# Patient Record
Sex: Female | Born: 1960 | Race: Black or African American | Hispanic: No | Marital: Single | Smoking: Former smoker
Health system: Southern US, Community
[De-identification: ages and names within clinical notes are randomized; demographics above are authoritative.]

## PROBLEM LIST (undated history)

## (undated) DIAGNOSIS — E785 Hyperlipidemia, unspecified: Secondary | ICD-10-CM

## (undated) DIAGNOSIS — I2699 Other pulmonary embolism without acute cor pulmonale: Secondary | ICD-10-CM

## (undated) DIAGNOSIS — M1990 Unspecified osteoarthritis, unspecified site: Secondary | ICD-10-CM

## (undated) DIAGNOSIS — T7840XA Allergy, unspecified, initial encounter: Secondary | ICD-10-CM

## (undated) DIAGNOSIS — I10 Essential (primary) hypertension: Secondary | ICD-10-CM

## (undated) DIAGNOSIS — I82409 Acute embolism and thrombosis of unspecified deep veins of unspecified lower extremity: Secondary | ICD-10-CM

## (undated) DIAGNOSIS — F32A Depression, unspecified: Secondary | ICD-10-CM

## (undated) DIAGNOSIS — F329 Major depressive disorder, single episode, unspecified: Secondary | ICD-10-CM

## (undated) HISTORY — PX: CHOLECYSTECTOMY: SHX55

## (undated) HISTORY — PX: BACK SURGERY: SHX140

## (undated) HISTORY — DX: Allergy, unspecified, initial encounter: T78.40XA

## (undated) HISTORY — DX: Hyperlipidemia, unspecified: E78.5

## (undated) HISTORY — DX: Major depressive disorder, single episode, unspecified: F32.9

## (undated) HISTORY — PX: TONSILLECTOMY: SUR1361

## (undated) HISTORY — DX: Depression, unspecified: F32.A

## (undated) HISTORY — PX: ABDOMINAL HYSTERECTOMY: SHX81

## (undated) HISTORY — DX: Unspecified osteoarthritis, unspecified site: M19.90

---

## 1998-09-28 ENCOUNTER — Other Ambulatory Visit: Admission: RE | Admit: 1998-09-28 | Discharge: 1998-09-28 | Payer: Self-pay | Admitting: Obstetrics and Gynecology

## 1999-09-05 ENCOUNTER — Ambulatory Visit (HOSPITAL_COMMUNITY): Admission: RE | Admit: 1999-09-05 | Discharge: 1999-09-05 | Payer: Self-pay | Admitting: Obstetrics & Gynecology

## 1999-09-05 ENCOUNTER — Encounter: Payer: Self-pay | Admitting: Obstetrics & Gynecology

## 1999-10-24 ENCOUNTER — Other Ambulatory Visit: Admission: RE | Admit: 1999-10-24 | Discharge: 1999-10-24 | Payer: Self-pay | Admitting: Obstetrics and Gynecology

## 2001-03-24 ENCOUNTER — Other Ambulatory Visit: Admission: RE | Admit: 2001-03-24 | Discharge: 2001-03-24 | Payer: Self-pay | Admitting: Obstetrics and Gynecology

## 2002-01-05 ENCOUNTER — Ambulatory Visit (HOSPITAL_COMMUNITY): Admission: RE | Admit: 2002-01-05 | Discharge: 2002-01-05 | Payer: Self-pay | Admitting: Obstetrics and Gynecology

## 2002-01-05 ENCOUNTER — Encounter: Payer: Self-pay | Admitting: Obstetrics and Gynecology

## 2002-04-08 ENCOUNTER — Other Ambulatory Visit: Admission: RE | Admit: 2002-04-08 | Discharge: 2002-04-08 | Payer: Self-pay | Admitting: Obstetrics and Gynecology

## 2003-04-20 ENCOUNTER — Other Ambulatory Visit: Admission: RE | Admit: 2003-04-20 | Discharge: 2003-04-20 | Payer: Self-pay | Admitting: Obstetrics and Gynecology

## 2003-12-30 ENCOUNTER — Encounter: Admission: RE | Admit: 2003-12-30 | Discharge: 2003-12-30 | Payer: Self-pay | Admitting: Obstetrics and Gynecology

## 2004-04-20 ENCOUNTER — Other Ambulatory Visit: Admission: RE | Admit: 2004-04-20 | Discharge: 2004-04-20 | Payer: Self-pay | Admitting: Obstetrics and Gynecology

## 2005-02-01 ENCOUNTER — Encounter: Admission: RE | Admit: 2005-02-01 | Discharge: 2005-05-02 | Payer: Self-pay | Admitting: Internal Medicine

## 2014-12-06 ENCOUNTER — Emergency Department (HOSPITAL_BASED_OUTPATIENT_CLINIC_OR_DEPARTMENT_OTHER)
Admission: EM | Admit: 2014-12-06 | Discharge: 2014-12-06 | Disposition: A | Discharge status: Home / Self Care | Payer: 59 | Attending: Emergency Medicine | Admitting: Emergency Medicine

## 2014-12-06 ENCOUNTER — Encounter (HOSPITAL_BASED_OUTPATIENT_CLINIC_OR_DEPARTMENT_OTHER): Payer: Self-pay

## 2014-12-06 DIAGNOSIS — K625 Hemorrhage of anus and rectum: Secondary | ICD-10-CM | POA: Insufficient documentation

## 2014-12-06 DIAGNOSIS — I10 Essential (primary) hypertension: Secondary | ICD-10-CM | POA: Diagnosis not present

## 2014-12-06 DIAGNOSIS — Z7901 Long term (current) use of anticoagulants: Secondary | ICD-10-CM | POA: Insufficient documentation

## 2014-12-06 DIAGNOSIS — Z86718 Personal history of other venous thrombosis and embolism: Secondary | ICD-10-CM | POA: Diagnosis not present

## 2014-12-06 DIAGNOSIS — Z86711 Personal history of pulmonary embolism: Secondary | ICD-10-CM | POA: Diagnosis not present

## 2014-12-06 HISTORY — DX: Essential (primary) hypertension: I10

## 2014-12-06 HISTORY — DX: Acute embolism and thrombosis of unspecified deep veins of unspecified lower extremity: I82.409

## 2014-12-06 HISTORY — DX: Other pulmonary embolism without acute cor pulmonale: I26.99

## 2014-12-06 LAB — COMPREHENSIVE METABOLIC PANEL
ALT: 18 U/L (ref 14–54)
AST: 18 U/L (ref 15–41)
Albumin: 3.5 g/dL (ref 3.5–5.0)
Alkaline Phosphatase: 94 U/L (ref 38–126)
Anion gap: 7 (ref 5–15)
BUN: 23 mg/dL — ABNORMAL HIGH (ref 6–20)
CO2: 24 mmol/L (ref 22–32)
Calcium: 9 mg/dL (ref 8.9–10.3)
Chloride: 109 mmol/L (ref 101–111)
Creatinine, Ser: 0.85 mg/dL (ref 0.44–1.00)
GFR calc Af Amer: >60 mL/min (ref >60)
GFR calc non Af Amer: >60 mL/min (ref >60)
Glucose, Bld: 111 mg/dL — ABNORMAL HIGH (ref 70–99)
Potassium: 3.8 mmol/L (ref 3.5–5.1)
Sodium: 140 mmol/L (ref 135–145)
Total Bilirubin: 0.4 mg/dL (ref 0.3–1.2)
Total Protein: 7.7 g/dL (ref 6.5–8.1)

## 2014-12-06 LAB — CBC WITH DIFFERENTIAL/PLATELET
Basophils Absolute: 0.1 10*3/uL (ref 0.0–0.1)
Basophils Relative: 1 % (ref 0–1)
Eosinophils Absolute: 0.2 10*3/uL (ref 0.0–0.7)
Eosinophils Relative: 2 % (ref 0–5)
HCT: 41.3 % (ref 36.0–46.0)
Hemoglobin: 14 g/dL (ref 12.0–15.0)
Lymphocytes Relative: 30 % (ref 12–46)
Lymphs Abs: 2.5 10*3/uL (ref 0.7–4.0)
MCH: 30.1 pg (ref 26.0–34.0)
MCHC: 33.9 g/dL (ref 30.0–36.0)
MCV: 88.8 fL (ref 78.0–100.0)
Monocytes Absolute: 0.8 10*3/uL (ref 0.1–1.0)
Monocytes Relative: 9 % (ref 3–12)
Neutro Abs: 4.8 10*3/uL (ref 1.7–7.7)
Neutrophils Relative %: 58 % (ref 43–77)
Platelets: 366 10*3/uL (ref 150–400)
RBC: 4.65 MIL/uL (ref 3.87–5.11)
RDW: 13.9 % (ref 11.5–15.5)
WBC: 8.3 10*3/uL (ref 4.0–10.5)

## 2014-12-06 NOTE — Discharge Instructions (Signed)
Return to the ED with any concerns including abdominal pain, increased blood, vomiting blood, fainting, dizziness upon standing. Shortness of breathing, decreased level of alertness/lethargy, or any other alarming symptoms

## 2014-12-06 NOTE — ED Notes (Signed)
Reports 2 episodes of rectal bleeding since last night. Pt is on Xarelto due to recent PE and DVT.

## 2014-12-06 NOTE — ED Provider Notes (Signed)
CSN: [id number]     Arrival date & time 12/06/14  1806 History   First MD Initiated Contact with Patient 12/06/14 2019     Chief Complaint  Patient presents with  . Rectal Bleeding     (Consider location/radiation/quality/duration/timing/severity/associated sxs/prior Treatment) HPI  Pt with hx of DVT/PE presenting at her doctor's request to get her CBC checked.  She states she was switched from coumadin to xarelto last week after being started on blood thinners during recent hospitalization for PE and DVT.  Pt yesterday noted 2 bowel movements when she wiped afterwards and noted blood on the toilet tissue.  No abdominal pain.  No large amounts of rectal bleeding, no dark tarry stool.  No vomiting.  No easy bruising or bleeding.  No change in her leg pain or swelling, no chest pain or difficulty breathing.  She called her doctor this morning who advised her to have her hemoglobin checked at an urgent care today.  She has not taken this morning or evenings doses of xarelto.  There are no other associated systemic symptoms, there are no other alleviating or modifying factors.   Past Medical History  Diagnosis Date  . DVT (deep venous thrombosis)   . Pulmonary emboli   . Hypertension    Past Surgical History  Procedure Laterality Date  . Abdominal hysterectomy    . Cholecystectomy     No family history on file. History  Substance Use Topics  . Smoking status: Never Smoker   . Smokeless tobacco: Not on file  . Alcohol Use: No   OB History    No data available     Review of Systems  ROS reviewed and all otherwise negative except for mentioned in HPI    Allergies  Codeine  Home Medications   Prior to Admission medications   Medication Sig Start Date End Date Taking? Authorizing Provider  Rivaroxaban (XARELTO) 15 MG TABS tablet Take 15 mg by mouth 2 (two) times daily with a meal.   Yes Historical Provider, MD   BP 162/79 mmHg  Pulse 85  Temp(Src) 98.4 F (36.9 C) (Oral)   Resp 18  Ht 5\' 8"  (1.727 m)  Wt 375 lb (170.099 kg)  BMI 57.03 kg/m2  SpO2 99%  Vitals reviewed Physical Exam  Physical Examination: General appearance - alert, well appearing, and in no distress Mental status - alert, oriented to person, place, and time Eyes - no conjunctival injection no scleral icterus Mouth - mucous membranes moist, pharynx normal without lesions Chest - clear to auscultation, no wheezes, rales or rhonchi, symmetric air entry Heart - normal rate, regular rhythm, normal S1, S2, no murmurs, rubs, clicks or gallops Abdomen - soft, nontender, nondistended, no masses or organomegaly Extremities - peripheral pulses normal, no pedal edema, no clubbing or cyanosis Skin - normal coloration and turgor, no rashes  ED Course  Procedures (including critical care time) Labs Review Labs Reviewed  COMPREHENSIVE METABOLIC PANEL - Abnormal; Notable for the following:    Glucose, Bld 111 (*)    BUN 23 (*)    All other components within normal limits  CBC WITH DIFFERENTIAL/PLATELET    Imaging Review No results found.   EKG Interpretation None      MDM   Final diagnoses:  Rectal bleeding    Pt presenting after 2 episodes of blood on toilet tissue.  She was started on xarelto last week.  She was advised by her doctor to have her hemoglobin checked today.  Her doctor  told her she would let her know what the plan would be about her xarelto after having this checked. Pt has skipped her doses for today.  Is going to talk to her doctor in the morning.  Hemoglobin is 14.  No active bleeding or abdominal pain.  No syncope or lightheadedness.  D/w patient that GI bleeds can become life threatening- no sign of this at this time.  She is in close contact with her doctor, texting her the results.  She will followup- I agree with holding her xarelto until plan confirmed with her doctor. Discharged with strict return precautions.  Pt agreeable with plan.    Jerelyn ScottMartha Linker, MD 12/06/14  2213

## 2017-02-04 ENCOUNTER — Emergency Department (HOSPITAL_COMMUNITY): Payer: 59

## 2017-02-04 ENCOUNTER — Encounter (HOSPITAL_COMMUNITY): Payer: Self-pay

## 2017-02-04 ENCOUNTER — Encounter (HOSPITAL_COMMUNITY): Payer: Self-pay | Admitting: Emergency Medicine

## 2017-02-04 ENCOUNTER — Observation Stay (HOSPITAL_COMMUNITY)
Admission: EM | Admit: 2017-02-04 | Discharge: 2017-02-06 | DRG: 176 | Disposition: A | Discharge status: Home / Self Care | Payer: 59 | Attending: Internal Medicine | Admitting: Internal Medicine

## 2017-02-04 ENCOUNTER — Ambulatory Visit (HOSPITAL_COMMUNITY)
Admission: EM | Admit: 2017-02-04 | Discharge: 2017-02-04 | Disposition: A | Discharge status: Nursing Home (Includes ICF) | Payer: 59 | Source: Home / Self Care | Attending: Internal Medicine | Admitting: Internal Medicine

## 2017-02-04 DIAGNOSIS — Z86711 Personal history of pulmonary embolism: Secondary | ICD-10-CM | POA: Diagnosis present

## 2017-02-04 DIAGNOSIS — I10 Essential (primary) hypertension: Secondary | ICD-10-CM | POA: Diagnosis present

## 2017-02-04 DIAGNOSIS — R0600 Dyspnea, unspecified: Secondary | ICD-10-CM

## 2017-02-04 DIAGNOSIS — R42 Dizziness and giddiness: Secondary | ICD-10-CM

## 2017-02-04 DIAGNOSIS — Z79899 Other long term (current) drug therapy: Secondary | ICD-10-CM

## 2017-02-04 DIAGNOSIS — R0602 Shortness of breath: Secondary | ICD-10-CM

## 2017-02-04 DIAGNOSIS — I2782 Chronic pulmonary embolism: Secondary | ICD-10-CM

## 2017-02-04 DIAGNOSIS — I2699 Other pulmonary embolism without acute cor pulmonale: Secondary | ICD-10-CM | POA: Diagnosis not present

## 2017-02-04 DIAGNOSIS — Z86718 Personal history of other venous thrombosis and embolism: Secondary | ICD-10-CM | POA: Diagnosis not present

## 2017-02-04 DIAGNOSIS — I2690 Septic pulmonary embolism without acute cor pulmonale: Secondary | ICD-10-CM

## 2017-02-04 LAB — BASIC METABOLIC PANEL
Anion gap: 12 (ref 5–15)
BUN: 15 mg/dL (ref 6–20)
CHLORIDE: 104 mmol/L (ref 101–111)
CO2: 21 mmol/L — AB (ref 22–32)
CREATININE: 0.95 mg/dL (ref 0.44–1.00)
Calcium: 9.5 mg/dL (ref 8.9–10.3)
GFR calc Af Amer: >60 mL/min (ref >60)
GFR calc non Af Amer: >60 mL/min (ref >60)
GLUCOSE: 93 mg/dL (ref 65–99)
Potassium: 3.9 mmol/L (ref 3.5–5.1)
SODIUM: 137 mmol/L (ref 135–145)

## 2017-02-04 LAB — D-DIMER, QUANTITATIVE: D-Dimer, Quant: 1.33 ug/mL-FEU — ABNORMAL HIGH (ref 0.00–0.50)

## 2017-02-04 LAB — I-STAT TROPONIN, ED: Troponin i, poc: 0 ng/mL (ref 0.00–0.08)

## 2017-02-04 LAB — CBC
HCT: 45.2 % (ref 36.0–46.0)
Hemoglobin: 14.9 g/dL (ref 12.0–15.0)
MCH: 30.5 pg (ref 26.0–34.0)
MCHC: 33 g/dL (ref 30.0–36.0)
MCV: 92.4 fL (ref 78.0–100.0)
PLATELETS: 320 10*3/uL (ref 150–400)
RBC: 4.89 MIL/uL (ref 3.87–5.11)
RDW: 13.8 % (ref 11.5–15.5)
WBC: 9.2 10*3/uL (ref 4.0–10.5)

## 2017-02-04 NOTE — ED Provider Notes (Signed)
[facility]-EMERGENCY DEPT Provider Note   CSN: [id number] Arrival date & time: 02/04/17  1455  By signing my name below, I, Ny'Kea Lewis, attest that this documentation has been prepared under the direction and in the presence of Endiya Klahr, MD. Electronically Signed: Karren Cobble, ED Scribe. 02/04/17. 11:49 PM.  History   Chief Complaint Chief Complaint  Patient presents with  . Shortness of Breath   The history is provided by the patient. No language interpreter was used.  Shortness of Breath  This is a recurrent problem. The problem occurs continuously.The current episode started 6 to 12 hours ago. The problem has not changed since onset.Associated symptoms include wheezing and leg swelling. Pertinent negatives include no fever. It is unknown what precipitated the problem. She has tried nothing for the symptoms. The treatment provided no relief. She has had prior hospitalizations. She has had prior ED visits. Associated medical issues include PE and DVT.    HPI Comments: Latasha Moore is a 56 y.o. female with a history of DVT, HTN, and PE, who presents to the Emergency Department complaining of gradually worsening, shortness of breath that began one week ago. Pt notes associated intermittent right leg swelling, and mild wheezing. She was seen at [facility] and sent to the ED for further evaluation. Her D-Dimer results were remarkable. She reports a history of DVT and states the last time she had one was about three years ago. She was prescribed Xarelto and stayed on that for ten months. Pt is not currently on anticoagulants. She reports back in May she drove back and forth to [place] in five hour increments.    Past Medical History:  Diagnosis Date  . DVT (deep venous thrombosis) (HCC)   . Hypertension   . Pulmonary emboli (HCC)    There are no active problems to display for this patient.  Past Surgical History:  Procedure Laterality Date  . ABDOMINAL HYSTERECTOMY    .  CHOLECYSTECTOMY     OB History    No data available     Home Medications    Prior to Admission medications   Medication Sig Start Date End Date Taking? Authorizing Provider  Rivaroxaban (XARELTO) 15 MG TABS tablet Take 15 mg by mouth 2 (two) times daily with a meal.    [provider]  valsartan (DIOVAN) 160 MG tablet Take 160 mg by mouth daily.    [provider]   Family History History reviewed. No pertinent family history.  Social History Social History  Substance Use Topics  . Smoking status: Never Smoker  . Smokeless tobacco: Never Used  . Alcohol use No    Allergies   Codeine  Review of Systems Review of Systems  Constitutional: Negative for fever.  Respiratory: Positive for shortness of breath and wheezing.   Cardiovascular: Positive for leg swelling.  All other systems reviewed and are negative.  Physical Exam Updated Vital Signs BP (!) 169/106   Pulse (!) 55   Temp 98 F (36.7 C) (Oral)   Resp (!) 22   Ht 5' 10.5" (1.791 m)   Wt (!) 463 lb (210 kg)   SpO2 97%   BMI 65.49 kg/m   Physical Exam  Constitutional: She appears well-developed and well-nourished.  HENT:  Head: Normocephalic.  Mouth/Throat: Oropharynx is clear and moist. No oropharyngeal exudate.  Eyes: Conjunctivae and EOM are normal. Pupils are equal, round, and reactive to light. Right eye exhibits no discharge. Left eye exhibits no discharge. No scleral icterus.  Neck: Normal range of motion. Neck supple. No JVD present. No tracheal deviation present.  Trachea is midline. No stridor or carotid bruits.   Cardiovascular: Normal rate, regular rhythm, normal heart sounds and intact distal pulses.   No murmur heard. Pulmonary/Chest: Effort normal and breath sounds normal. No stridor. No respiratory distress. She has no wheezes. She has no rales.  Lungs CTA bilaterally.  Abdominal: Soft. Bowel sounds are normal. She exhibits no distension. There is no tenderness. There is no  rebound and no guarding.  Musculoskeletal: Normal range of motion. She exhibits no edema, tenderness or deformity.  All compartments are soft. No palpable cords.   Lymphadenopathy:    She has no cervical adenopathy.  Neurological: She is alert. She has normal reflexes. She displays normal reflexes. She exhibits normal muscle tone.  Skin: Skin is warm and dry. Capillary refill takes less than 2 seconds.  Psychiatric: She has a normal mood and affect. Her behavior is normal.  Nursing note and vitals reviewed.  ED Treatments / Results  DIAGNOSTIC STUDIES: Oxygen Saturation is 97% on RA, adequate by my interpretation.   COORDINATION OF CARE: 11:40 PM-Discussed next steps with pt. Pt verbalized understanding and is agreeable with the plan.   Labs (all labs ordered are listed, but only abnormal results are displayed)  Results for orders placed or performed during the hospital encounter of 02/04/17  Basic metabolic panel  Result Value Ref Range   Sodium 137 135 - 145 mmol/L   Potassium 3.9 3.5 - 5.1 mmol/L   Chloride 104 101 - 111 mmol/L   CO2 21 (L) 22 - 32 mmol/L   Glucose, Bld 93 65 - 99 mg/dL   BUN 15 6 - 20 mg/dL   Creatinine, Ser 3.240.95 0.44 - 1.00 mg/dL   Calcium 9.5 8.9 - 40.110.3 mg/dL   GFR calc non Af Amer >60 >60 mL/min   GFR calc Af Amer >60 >60 mL/min   Anion gap 12 5 - 15  CBC  Result Value Ref Range   WBC 9.2 4.0 - 10.5 K/uL   RBC 4.89 3.87 - 5.11 MIL/uL   Hemoglobin 14.9 12.0 - 15.0 g/dL   HCT 02.745.2 25.336.0 - 66.446.0 %   MCV 92.4 78.0 - 100.0 fL   MCH 30.5 26.0 - 34.0 pg   MCHC 33.0 30.0 - 36.0 g/dL   RDW 40.313.8 47.411.5 - 25.915.5 %   Platelets 320 150 - 400 K/uL  D-dimer, quantitative (not at [facility])  Result Value Ref Range   D-Dimer, Quant 1.33 (H) 0.00 - 0.50 ug/mL-FEU  I-stat troponin, ED  Result Value Ref Range   Troponin i, poc 0.00 0.00 - 0.08 ng/mL   Comment 3           Dg Chest 2 View  Result Date: 02/04/2017 CLINICAL DATA:  Shortness of breath EXAM: CHEST  2 VIEW  COMPARISON:  11/11/2014 FINDINGS: Normal heart size and mediastinal contours. No acute infiltrate or edema. No effusion or pneumothorax. No acute osseous findings. IMPRESSION: Negative chest. Electronically Signed   By: Marnee SpringJonathon  Watts M.D.   On: 02/04/2017 15:53   Ct Angio Chest Pe W And/or Wo Contrast  Result Date: 02/05/2017 CLINICAL DATA:  Shortness of breath.  History of pulmonary embolus. EXAM: CT ANGIOGRAPHY CHEST WITH CONTRAST TECHNIQUE: Multidetector CT imaging of the chest was performed using the standard protocol during bolus administration of intravenous contrast. Multiplanar CT image reconstructions and MIPs were obtained to evaluate the vascular anatomy. CONTRAST:  100 cc  Isovue 370 IV COMPARISON:  Radiograph earlier this day.  CT 11/11/2014 FINDINGS: Cardiovascular: There are probable filling defects in the segmental left lower lobe pulmonary arteries (series 8, image 123), unclear whether this represents residual chronic thrombus or recurrent thrombus as filling defects were previously seen in this region. No other main or lobar pulmonary emboli, cannot assess more distal branches due to contrast bolus and soft tissue attenuation from body habitus. There is no right heart strain, RV to LV ratio of less than 1. The thoracic aorta is normal in caliber. The heart is normal in size. Mediastinum/Nodes: No pericardial effusion. No mediastinal or hilar adenopathy. The esophagus is decompressed. Lungs/Pleura: Minimal subsegmental atelectasis in the right lower lobe. The lungs are otherwise clear. No pulmonary edema or pleural fluid. Body habitus degrades image quality. Upper Abdomen: No acute abnormality. Musculoskeletal: There are no acute or suspicious osseous abnormalities. Degenerative change in the spine. Review of the MIP images confirms the above findings. IMPRESSION: 1. Findings suspicious for segmental pulmonary emboli in the left lower lobe, unclear whether this is chronic or recurrent, favor  recurrent. Other pulmonary emboli on prior exam have resolved. 2. Exam is technically limited. Critical Value/emergent results were called by telephone at the time of interpretation on 02/05/2017 at 1:10 am to Dr. Cy Blamer , who verbally acknowledged these results. Electronically Signed   By: Rubye Oaks M.D.   On: 02/05/2017 01:10  \  EKG  EKG Interpretation  Date/Time:  Monday February 04 2017 15:08:31 EDT Ventricular Rate:  102 PR Interval:  176 QRS Duration: 86 QT Interval:  360 QTC Calculation: 469 R Axis:   34 Text Interpretation:  Sinus tachycardia Low voltage QRS Cannot rule out Anterior infarct , age undetermined Abnormal ECG No old tracing to compare Confirmed by Dione Booze ([id number]) on 02/04/2017 4:00:41 PM       Radiology Dg Chest 2 View  Result Date: 02/04/2017 CLINICAL DATA:  Shortness of breath EXAM: CHEST  2 VIEW COMPARISON:  11/11/2014 FINDINGS: Normal heart size and mediastinal contours. No acute infiltrate or edema. No effusion or pneumothorax. No acute osseous findings. IMPRESSION: Negative chest. Electronically Signed   By: Marnee Spring M.D.   On: 02/04/2017 15:53    Procedures Procedures (including critical care time)  Medications Ordered in ED  Medications  heparin ADULT infusion 100 units/mL (25000 units/266mL sodium chloride 0.45%) (2,000 Units/hr Intravenous New Bag/Given 02/05/17 0127)  iopamidol (ISOVUE-370) 76 % injection (100 mLs  Contrast Given 02/05/17 0043)  heparin bolus via infusion 7,500 Units (7,500 Units Intravenous Bolus from Bag 02/05/17 0128)      Final Clinical Impressions(s) / ED Diagnoses  Pulmonary embolism:  Admit to medicine.    I personally performed the services described in this documentation, which was scribed in my presence. The recorded information has been reviewed and is accurate.      Silvie Obremski, MD 02/05/17 0140

## 2017-02-04 NOTE — ED Triage Notes (Signed)
Pt endorses shob x 1 week, has hx of PE and DVT. [facility]. Able to speak in 4-5 word sentences.

## 2017-02-04 NOTE — Discharge Instructions (Signed)
Based on your signs, symptoms, and physical exam findings, unable to rule out DVT/PE. Recommend going to the emergency room for further evaluation your condition.

## 2017-02-04 NOTE — ED Notes (Signed)
Notified provider during a five minute ambulation . 02 sat stayed between 96% - 100%, heart rate went from 98 -135 bpm.

## 2017-02-04 NOTE — ED Triage Notes (Signed)
Pt reports getting overheated a week ago.  Since then she has been having some dizziness, SOB and HBP (per another urgent care)  Pt was given an antibiotic for her breathing issues.  Pt presents today with SOB at rest.  She also reports tingling in her left arm.  Pt is warm to the touchy and very shaky.  She walks with a cane.

## 2017-02-04 NOTE — ED Provider Notes (Signed)
CSN: [id number]     Arrival date & time 02/04/17  1239 History   None    Chief Complaint  Patient presents with  . Shortness of Breath  . Dizziness  . left arm tingling   (Consider location/radiation/quality/duration/timing/severity/associated sxs/prior Treatment) 56 year old female presents to clinic with a chief complaint of shortness of breath, it is worse with exertion. She does have a past history of DVT, and pulmonary embolism. She is also morbidly obese. She denies any recent travel, hospitalizations, she does not smoke, is not taking any oral steroids. She has had some swelling in her right upper thigh, no pain or swelling in either of the calf muscles. She has no chest pain, no sensation of a rapid heartbeat or palpitations, denies any nausea, vomiting, or other symptoms. She states with her shortness of breath, she "can't get air"      Past Medical History:  Diagnosis Date  . DVT (deep venous thrombosis) (HCC)   . Hypertension   . Pulmonary emboli [facility](HCC)    Past Surgical History:  Procedure Laterality Date  . ABDOMINAL HYSTERECTOMY    . CHOLECYSTECTOMY     History reviewed. No pertinent family history. Social History  Substance Use Topics  . Smoking status: Never Smoker  . Smokeless tobacco: Never Used  . Alcohol use No   OB History    No data available     Review of Systems  HENT: Negative.   Respiratory: Positive for shortness of breath. Negative for cough and wheezing.   Cardiovascular: Positive for leg swelling. Negative for chest pain and palpitations.  Gastrointestinal: Negative.   Musculoskeletal: Negative.   Skin: Negative.   Neurological: Positive for weakness and numbness. Negative for syncope, facial asymmetry, light-headedness and headaches.    Allergies  Codeine  Home Medications   Prior to Admission medications   Medication Sig Start Date End Date Taking? Authorizing Provider  valsartan (DIOVAN) 160 MG tablet Take 160 mg by mouth daily.    Yes [provider]  Rivaroxaban (XARELTO) 15 MG TABS tablet Take 15 mg by mouth 2 (two) times daily with a meal.    [provider]   Meds Ordered and Administered this Visit  Medications - No data to display  BP (!) 174/98 (BP Location: Right Wrist)   Pulse 91   Temp 98.3 F (36.8 C) (Oral)   SpO2 99%  No data found.   Physical Exam  Constitutional: She is oriented to person, place, and time. She appears well-developed and well-nourished. No distress.  HENT:  Head: Normocephalic.  Right Ear: External ear normal.  Left Ear: External ear normal.  Eyes: Conjunctivae are normal.  Neck: Normal range of motion.  Cardiovascular: Normal rate and regular rhythm.   Pulmonary/Chest: Effort normal and breath sounds normal. No respiratory distress. She has no wheezes.  Musculoskeletal: She exhibits no edema.  Negative Homans sign, no palpable cord, no tenderness in either calf muscle, however it is notable swelling in the area of the right knee.  Neurological: She is alert and oriented to person, place, and time. No cranial nerve deficit or sensory deficit. She exhibits normal muscle tone. Coordination normal.  Skin: Skin is warm and dry. Capillary refill takes less than 2 seconds. She is not diaphoretic.  Psychiatric: She has a normal mood and affect. Her behavior is normal.  Nursing note and vitals reviewed.   Urgent Care Course     Procedures (including critical care time)  Labs Review Labs Reviewed - No data  to display  Imaging Review No results found.      MDM   1. Dyspnea, unspecified type      Stroke screen negative.   There is no S1, Q3, T3 pattern, no apparent right atrial enlargement, right axis deviation, complete or incomplete right bundle-branch block, or sinus tachycardia on the EKG or other findings significant or notable for possible PE.  Walking SPO2 dropped from 197, heart rate increased from 95-136.  PERC 3/8, unable to use PERC to  rule out PE, Recommend going to the emergency room for further evaluation and management of her symptoms.     Dorena Bodo, NP 02/04/17 1406

## 2017-02-05 ENCOUNTER — Encounter (HOSPITAL_COMMUNITY): Payer: Self-pay | Admitting: Emergency Medicine

## 2017-02-05 ENCOUNTER — Emergency Department (HOSPITAL_COMMUNITY): Payer: 59

## 2017-02-05 ENCOUNTER — Inpatient Hospital Stay (HOSPITAL_BASED_OUTPATIENT_CLINIC_OR_DEPARTMENT_OTHER): Payer: 59

## 2017-02-05 DIAGNOSIS — I2690 Septic pulmonary embolism without acute cor pulmonale: Secondary | ICD-10-CM | POA: Diagnosis not present

## 2017-02-05 DIAGNOSIS — I10 Essential (primary) hypertension: Secondary | ICD-10-CM | POA: Diagnosis not present

## 2017-02-05 DIAGNOSIS — I5032 Chronic diastolic (congestive) heart failure: Secondary | ICD-10-CM

## 2017-02-05 DIAGNOSIS — I2699 Other pulmonary embolism without acute cor pulmonale: Principal | ICD-10-CM

## 2017-02-05 DIAGNOSIS — Z86711 Personal history of pulmonary embolism: Secondary | ICD-10-CM | POA: Diagnosis present

## 2017-02-05 LAB — COMPREHENSIVE METABOLIC PANEL
ALBUMIN: 3.6 g/dL (ref 3.5–5.0)
ALK PHOS: 73 U/L (ref 38–126)
ALT: 28 U/L (ref 14–54)
AST: 28 U/L (ref 15–41)
Anion gap: 10 (ref 5–15)
BILIRUBIN TOTAL: 0.8 mg/dL (ref 0.3–1.2)
BUN: 14 mg/dL (ref 6–20)
CALCIUM: 9 mg/dL (ref 8.9–10.3)
CO2: 24 mmol/L (ref 22–32)
CREATININE: 0.94 mg/dL (ref 0.44–1.00)
Chloride: 105 mmol/L (ref 101–111)
GFR calc Af Amer: >60 mL/min (ref >60)
GLUCOSE: 91 mg/dL (ref 65–99)
POTASSIUM: 3.5 mmol/L (ref 3.5–5.1)
Sodium: 139 mmol/L (ref 135–145)
TOTAL PROTEIN: 7.3 g/dL (ref 6.5–8.1)

## 2017-02-05 LAB — PROTIME-INR
INR: 1.15
PROTHROMBIN TIME: 14.7 s (ref 11.4–15.2)

## 2017-02-05 LAB — CBC
HCT: 40 % (ref 36.0–46.0)
Hemoglobin: 12.8 g/dL (ref 12.0–15.0)
MCH: 29.6 pg (ref 26.0–34.0)
MCHC: 32 g/dL (ref 30.0–36.0)
MCV: 92.6 fL (ref 78.0–100.0)
PLATELETS: 294 10*3/uL (ref 150–400)
RBC: 4.32 MIL/uL (ref 3.87–5.11)
RDW: 14 % (ref 11.5–15.5)
WBC: 9.9 10*3/uL (ref 4.0–10.5)

## 2017-02-05 LAB — ECHOCARDIOGRAM COMPLETE
Height: 70 in
Weight: 7467.2 oz

## 2017-02-05 LAB — ANTITHROMBIN III: ANTITHROMB III FUNC: 90 % (ref 75–120)

## 2017-02-05 LAB — HIV ANTIBODY (ROUTINE TESTING W REFLEX): HIV Screen 4th Generation wRfx: NONREACTIVE

## 2017-02-05 MED ORDER — ACETAMINOPHEN 650 MG RE SUPP
650.0000 mg | Freq: Four times a day (QID) | RECTAL | Status: DC | PRN
Start: 2017-02-05 — End: 2017-02-06

## 2017-02-05 MED ORDER — HEPARIN BOLUS VIA INFUSION
7500.0000 [IU] | Freq: Once | INTRAVENOUS | Status: AC
  Administered 2017-02-05: 7500 [IU] via INTRAVENOUS
  Filled 2017-02-05: qty 7500

## 2017-02-05 MED ORDER — ALBUTEROL SULFATE (2.5 MG/3ML) 0.083% IN NEBU: 3.0000 mL | INHALATION_SOLUTION | Freq: Four times a day (QID) | RESPIRATORY_TRACT | Status: DC | PRN

## 2017-02-05 MED ORDER — IOPAMIDOL (ISOVUE-370) INJECTION 76%
INTRAVENOUS | Status: AC
  Administered 2017-02-05: 100 mL
  Filled 2017-02-05: qty 100

## 2017-02-05 MED ORDER — WARFARIN SODIUM 10 MG PO TABS: 10.0000 mg | ORAL_TABLET | Freq: Once | ORAL | Status: DC

## 2017-02-05 MED ORDER — IRBESARTAN 300 MG PO TABS
150.0000 mg | ORAL_TABLET | Freq: Every day | ORAL | Status: DC
  Administered 2017-02-05 – 2017-02-06 (×2): 150 mg via ORAL
  Filled 2017-02-05 (×2): qty 1

## 2017-02-05 MED ORDER — ACETAMINOPHEN 325 MG PO TABS: 650.0000 mg | ORAL_TABLET | Freq: Four times a day (QID) | ORAL | Status: DC | PRN

## 2017-02-05 MED ORDER — HYDRALAZINE HCL 20 MG/ML IJ SOLN
5.0000 mg | Freq: Four times a day (QID) | INTRAMUSCULAR | Status: DC | PRN
  Administered 2017-02-05: 5 mg via INTRAVENOUS

## 2017-02-05 MED ORDER — LABETALOL HCL 5 MG/ML IV SOLN
5.0000 mg | Freq: Once | INTRAVENOUS | Status: AC
  Administered 2017-02-05: 5 mg via INTRAVENOUS
  Filled 2017-02-05: qty 4

## 2017-02-05 MED ORDER — LORATADINE 10 MG PO TABS
10.0000 mg | ORAL_TABLET | Freq: Every day | ORAL | Status: DC
  Administered 2017-02-05 – 2017-02-06 (×2): 10 mg via ORAL
  Filled 2017-02-05 (×2): qty 1

## 2017-02-05 MED ORDER — COUMADIN BOOK
Freq: Once | Status: DC
  Filled 2017-02-05: qty 1

## 2017-02-05 MED ORDER — WARFARIN - PHARMACIST DOSING INPATIENT: Freq: Every day | Status: DC

## 2017-02-05 MED ORDER — RIVAROXABAN 15 MG PO TABS
15.0000 mg | ORAL_TABLET | Freq: Two times a day (BID) | ORAL | Status: DC
  Administered 2017-02-05 – 2017-02-06 (×3): 15 mg via ORAL
  Filled 2017-02-05 (×3): qty 1

## 2017-02-05 MED ORDER — HEPARIN (PORCINE) IN NACL 100-0.45 UNIT/ML-% IJ SOLN
2000.0000 [IU]/h | INTRAMUSCULAR | Status: AC
  Administered 2017-02-05 (×2): 2000 [IU]/h via INTRAVENOUS
  Filled 2017-02-05: qty 250

## 2017-02-05 NOTE — Progress Notes (Deleted)
RE: Benefit check  Received: Today  Message Contents  [name], [name]         # 4. [facility] # [id number]   1. ELIQUIS 2.5 MG BID  COVER- YES  CO-PAY- $ 60.00  TIER- 3 DRUG  PRIOR APPROVAL- NO    2. ELIQUIS  5 MG BID  COVER- YES  CO-PAY- $ 60.00  TIER- 3 DRUG  PRIOR APPROVAL- NO   PHARMACY : [facility]

## 2017-02-05 NOTE — Progress Notes (Signed)
56 year old lady with h/o PE, DVT, family  H/o PE , comes in for sob, found to have PE, started onIV heparin.  Plan to change to xarelto.  Patient admitted earlier this am. Please see Dr Elmyra RicksKim's note in detail.   Kathlen ModyVijaya Atalya Dano MD [phone]

## 2017-02-05 NOTE — Progress Notes (Addendum)
ANTICOAGULATION CONSULT NOTE - Initial Consult  Pharmacy Consult for Heparin/Warfarin Indication: pulmonary embolus  Allergies  Allergen Reactions  . Codeine Palpitations and Other (See Comments)    dizzy   Patient Measurements: Height: 5' 10.5" (179.1 cm) Weight: (!) 463 lb (210 kg) IBW/kg (Calculated) : 69.65  Vital Signs: Temp: 98 F (36.7 C) (07/09 1515) Temp Source: Oral (07/09 1515) BP: 196/99 (07/10 0230) Pulse Rate: 85 (07/10 0230)  Labs:  Recent Labs  02/04/17 1631 02/04/17 1700  HGB 14.9  --   HCT 45.2  --   PLT 320  --   CREATININE  --  0.95    Estimated Creatinine Clearance: 132.9 mL/min (by C-G formula based on SCr of 0.95 mg/dL).   Medical History: Past Medical History:  Diagnosis Date  . DVT (deep venous thrombosis) (HCC)   . Hypertension   . Pulmonary emboli [facility](HCC)    Assessment: 56 y/o F with hx of VTE, stopped Xarelto several years ago, CT angio now with possible recurrent PE, heparin started by EDP at 2000 units/hr with 7500 units BOLUS. CBC good. Renal function good.   Goal of Therapy:  Heparin level 0.3-0.7 units/ml Monitor platelets by anticoagulation protocol: Yes   Plan:  -Cont heparin at 2000 units/hr -Check heparin level at 1000  Abran DukeLedford, Graycen Sadlon 02/05/2017,3:01 AM   ===================== Addendum 4:12 AM MD also wishes to start with warfarin today, today will be Day #1/5 heparin/warfarin overlap  -Will check baseline INR with AM labs -Warfarin 10 mg PO x 1 at 1800 today -Daily PT/INR -Monitor for bleeding  Abran DukeJames Oral Remache, PharmD, BCPS Clinical Pharmacist Phone: [phone] ========================

## 2017-02-05 NOTE — Progress Notes (Signed)
  Echocardiogram 2D Echocardiogram has been performed.  Adal Sereno 02/05/2017, 9:40 AM

## 2017-02-05 NOTE — Progress Notes (Signed)
RE: Benefit check  Received: Today  Message Contents  [name], [name] CMA        # 4. S/W [facility] @ [facility] # [id number]    1. XARELTO  15 MG BID FOR 21 DAYS   COVER- YES  CO-PAY- $ 35.00  Q/L OF 1 PILL PER DAY  TIER- 2 DRUG  PRIOR APPROVAL- YES # [phone] FOR TWO PILL PER DAY   2.XARELTO 20 MG DAILY   COVER- YES  CO-PAY- $ 35.00 Q/L OF ONE PILL PER DAY  TIER- 2 DRUG  PRIOR APPROVAL- NO    PHARMACY : [facility]

## 2017-02-05 NOTE — Progress Notes (Signed)
ANTICOAGULATION CONSULT NOTE - Follow Up Consult  Pharmacy Consult for Heparin/Warfarin -> Xarelto Indication: pulmonary embolus  Allergies  Allergen Reactions  . Codeine Palpitations and Other (See Comments)    dizzy    Patient Measurements: Height: 5\' 10"  (177.8 cm) Weight: (!) 466 lb 11.2 oz (211.7 kg) IBW/kg (Calculated) : 68.5 Heparin Dosing Weight: 127 kg  Vital Signs: Temp: 98.4 F (36.9 C) (07/10 0822) Temp Source: Oral (07/10 0822) BP: 147/79 (07/10 1018) Pulse Rate: 86 (07/10 1018)  Labs:  Recent Labs  02/04/17 1631 02/04/17 1700 02/05/17 0532  HGB 14.9  --  12.8  HCT 45.2  --  40.0  PLT 320  --  294  LABPROT  --   --  14.7  INR  --   --  1.15  CREATININE  --  0.95 0.94    Estimated Creatinine Clearance: 134.3 mL/min (by C-G formula based on SCr of 0.94 mg/dL).  Assessment:   56 yr old female with hx PE/DVT, stopped Xarelto 3 years ago.  Possible recurrent vs chronic PE per CTA 02/05/17.    Started on IV heparin overnight, first heparin level was due mid-morning today. Coumadin was to begin tonight. Now to change to Xarelto.  Hypercoag panel sent 02/05/17. Echo pending.  Goal of Therapy:  INR 2-3 Heparin level 0.3-0.7 units/ml appropriate Xarelto dose for indication and renal function Monitor platelets by anticoagulation protocol: Yes   Plan:   Xarelto 15 mg BID x 21 days then 20 mg daily with supper.  Stop IV heparin when giving first Xarelto dose.  Dennie FettersEgan, Apollos Tenbrink Donovan, RPh Pager: [phone] 02/05/2017,11:25 AM

## 2017-02-05 NOTE — Discharge Instructions (Signed)
Information on my medicine - XARELTO (rivaroxaban)  This medication education was reviewed with me or my healthcare representative as part of my discharge preparation.  The pharmacist that spoke with me during my hospital stay was:  Dennie Fettersgan, Molley Houser Donovan, [facility]  WHY WAS Carlena HurlXARELTO PRESCRIBED FOR YOU? Xarelto was prescribed to treat blood clots that may have been found in the veins of your legs (deep vein thrombosis) or in your lungs (pulmonary embolism) and to reduce the risk of them occurring again.  What do you need to know about Xarelto? The starting dose is one 15 mg tablet taken TWICE daily with food for the FIRST 21 DAYS then on (enter date)  02/26/17  the dose is changed to one 20 mg tablet taken ONCE A DAY with your evening meal.  DO NOT stop taking Xarelto without talking to the health care provider who prescribed the medication.  Refill your prescription for 20 mg tablets before you run out.  After discharge, you should have regular check-up appointments with your healthcare provider that is prescribing your Xarelto.  In the future your dose may need to be changed if your kidney function changes by a significant amount.  What do you do if you miss a dose? If you are taking Xarelto TWICE DAILY and you miss a dose, take it as soon as you remember. You may take two 15 mg tablets (total 30 mg) at the same time then resume your regularly scheduled 15 mg twice daily the next day.  If you are taking Xarelto ONCE DAILY and you miss a dose, take it as soon as you remember on the same day then continue your regularly scheduled once daily regimen the next day. Do not take two doses of Xarelto at the same time.   Important Safety Information Xarelto is a blood thinner medicine that can cause bleeding. You should call your healthcare provider right away if you experience any of the following: ? Bleeding from an injury or your nose that does not stop. ? Unusual colored urine (red or dark brown)  or unusual colored stools (red or black). ? Unusual bruising for unknown reasons. ? A serious fall or if you hit your head (even if there is no bleeding).  Some medicines may interact with Xarelto and might increase your risk of bleeding while on Xarelto. To help avoid this, consult your healthcare provider or pharmacist prior to using any new prescription or non-prescription medications, including herbals, vitamins, non-steroidal anti-inflammatory drugs (NSAIDs) and supplements.  This website has more information on Xarelto: [email or web address].

## 2017-02-05 NOTE — H&P (Signed)
TRH H&P   Patient Demographics:    Latasha Moore, is a 56 y.o. female  MRN: [id number]   DOB - 1960-09-12  Admit Date - 02/04/2017  Outpatient Primary MD for the patient is Patient, No Pcp Per Andi Devon  Referring MD/NP/PA:   Dr. Nicanor Alcon  Outpatient Specialists:     Patient coming from: home  Chief Complaint  Patient presents with  . Shortness of Breath      HPI:    Latasha Moore  is a 56 y.o. female, w hx of PE.DVT previously on Xarelto, and stopped 3 years ago. Pt states that she started having difficulty breathing last Friday.  Pt was sitting at work and having difficulty breathing and thus presented to [facility] and sent to ED for evaluation.    In ED, EGK => sinus tach at 102,  D dimer was positive and CTA chest showed findings suspicious for sedgmental pulmonary emboli in the left lower lobe.     Review of systems:    In addition to the HPI above,  No Fever-chills, No Headache, No changes with Vision or hearing, No problems swallowing food or Liquids, No Chest pain, No Cough No Abdominal pain, No Nausea or Vommitting, Bowel movements are regular, No Blood in stool or Urine, No dysuria, No new skin rashes or bruises, No new joints pains-aches,  No new weakness, tingling, numbness in any extremity, No recent weight gain or loss, No polyuria, polydypsia or polyphagia, No significant Mental Stressors.  A full 10 point Review of Systems was done, except as stated above, all other Review of Systems were negative.   With Past History of the following :    Past Medical History:  Diagnosis Date  . DVT (deep venous thrombosis) (HCC)   . Hypertension   . Pulmonary emboli [facility])       Past Surgical History:  Procedure Laterality Date  . ABDOMINAL HYSTERECTOMY    . BACK SURGERY    . CHOLECYSTECTOMY        Social History:     Social History    Substance Use Topics  . Smoking status: Never Smoker  . Smokeless tobacco: Never Used  . Alcohol use No     Lives - at home  Mobility -   Walks by self   Family History :     Family History  Problem Relation Age of Onset  . Sleep apnea Brother   . Deep vein thrombosis Brother       Home Medications:   Prior to Admission medications   Medication Sig Start Date End Date Taking? Authorizing Provider  albuterol (PROVENTIL HFA;VENTOLIN HFA) 108 (90 Base) MCG/ACT inhaler Inhale 1-2 puffs into the lungs every 6 (six) hours as needed for wheezing or shortness of breath.   Yes [provider]  cetirizine-pseudoephedrine (ZYRTEC-D) 5-120 MG tablet Take 1 tablet by mouth daily  as needed for allergies.   Yes [provider]  diclofenac sodium (VOLTAREN) 1 % GEL Apply 2 g topically 4 (four) times daily as needed (pain).   Yes [provider]  ibuprofen (ADVIL,MOTRIN) 200 MG tablet Take 800 mg by mouth every 6 (six) hours as needed for moderate pain.   Yes [provider]  meloxicam (MOBIC) 15 MG tablet Take 15 mg by mouth daily as needed for pain.   Yes [provider]  montelukast (SINGULAIR) 10 MG tablet Take 10 mg by mouth daily as needed (allergies).   Yes [provider]  valsartan (DIOVAN) 160 MG tablet Take 160 mg by mouth daily.   Yes [provider]     Allergies:     Allergies  Allergen Reactions  . Codeine Palpitations and Other (See Comments)    dizzy     Physical Exam:   Vitals  Blood pressure (!) 196/99, pulse 85, temperature 98 F (36.7 C), temperature source Oral, resp. rate 16, height 5' 10.5" (1.791 m), weight (!) 210 kg (463 lb), SpO2 100 %.   1. Genera  lying in bed in NAD,    2. Normal affect and insight, Not Suicidal or Homicidal, Awake Alert, Oriented X 3.  3. No F.N deficits, ALL C.Nerves Intact, Strength 5/5 all 4 extremities, Sensation intact all 4 extremities, Plantars down  going.  4. Ears and Eyes appear Normal, Conjunctivae clear, PERRLA. Moist Oral Mucosa.  5. Supple Neck, No JVD, No cervical lymphadenopathy appriciated, No Carotid Bruits.  6. Symmetrical Chest wall movement, Good air movement bilaterally, CTAB.  7. RRR, No Gallops, Rubs or Murmurs, No Parasternal Heave.  8. Positive Bowel Sounds, Abdomen Soft, No tenderness, No organomegaly appriciated,No rebound -guarding or rigidity.  9.  No Cyanosis, Normal Skin Turgor, No Skin Rash or Bruise.  10. Good muscle tone,  joints appear normal , no effusions, Normal ROM.  11. No Palpable Lymph Nodes in Neck or Axillae     Data Review:    CBC  Recent Labs Lab 02/04/17 1631  WBC 9.2  HGB 14.9  HCT 45.2  PLT 320  MCV 92.4  MCH 30.5  MCHC 33.0  RDW 13.8   ------------------------------------------------------------------------------------------------------------------  Chemistries   Recent Labs Lab 02/04/17 1700  NA 137  K 3.9  CL 104  CO2 21*  GLUCOSE 93  BUN 15  CREATININE 0.95  CALCIUM 9.5   ------------------------------------------------------------------------------------------------------------------ estimated creatinine clearance is 132.9 mL/min (by C-G formula based on SCr of 0.95 mg/dL). ------------------------------------------------------------------------------------------------------------------ No results for input(s): TSH, T4TOTAL, T3FREE, THYROIDAB in the last 72 hours.  Invalid input(s): FREET3  Coagulation profile No results for input(s): INR, PROTIME in the last 168 hours. -------------------------------------------------------------------------------------------------------------------  Recent Labs  02/04/17 1631  DDIMER 1.33*   -------------------------------------------------------------------------------------------------------------------  Cardiac Enzymes No results for input(s): CKMB, TROPONINI, MYOGLOBIN in the last 168 hours.  Invalid  input(s): CK ------------------------------------------------------------------------------------------------------------------ No results found for: BNP   ---------------------------------------------------------------------------------------------------------------  Urinalysis No results found for: COLORURINE, APPEARANCEUR, LABSPEC, PHURINE, GLUCOSEU, HGBUR, BILIRUBINUR, KETONESUR, PROTEINUR, UROBILINOGEN, NITRITE, LEUKOCYTESUR  ----------------------------------------------------------------------------------------------------------------   Imaging Results:    Dg Chest 2 View  Result Date: 02/04/2017 CLINICAL DATA:  Shortness of breath EXAM: CHEST  2 VIEW COMPARISON:  11/11/2014 FINDINGS: Normal heart size and mediastinal contours. No acute infiltrate or edema. No effusion or pneumothorax. No acute osseous findings. IMPRESSION: Negative chest. Electronically Signed   By: Marnee SpringJonathon  Watts M.D.   On: 02/04/2017 15:53   Ct Angio Chest Pe W And/or Wo  Contrast  Result Date: 02/05/2017 CLINICAL DATA:  Shortness of breath.  History of pulmonary embolus. EXAM: CT ANGIOGRAPHY CHEST WITH CONTRAST TECHNIQUE: Multidetector CT imaging of the chest was performed using the standard protocol during bolus administration of intravenous contrast. Multiplanar CT image reconstructions and MIPs were obtained to evaluate the vascular anatomy. CONTRAST:  100 cc Isovue 370 IV COMPARISON:  Radiograph earlier this day.  CT 11/11/2014 FINDINGS: Cardiovascular: There are probable filling defects in the segmental left lower lobe pulmonary arteries (series 8, image 123), unclear whether this represents residual chronic thrombus or recurrent thrombus as filling defects were previously seen in this region. No other main or lobar pulmonary emboli, cannot assess more distal branches due to contrast bolus and soft tissue attenuation from body habitus. There is no right heart strain, RV to LV ratio of less than 1. The thoracic  aorta is normal in caliber. The heart is normal in size. Mediastinum/Nodes: No pericardial effusion. No mediastinal or hilar adenopathy. The esophagus is decompressed. Lungs/Pleura: Minimal subsegmental atelectasis in the right lower lobe. The lungs are otherwise clear. No pulmonary edema or pleural fluid. Body habitus degrades image quality. Upper Abdomen: No acute abnormality. Musculoskeletal: There are no acute or suspicious osseous abnormalities. Degenerative change in the spine. Review of the MIP images confirms the above findings. IMPRESSION: 1. Findings suspicious for segmental pulmonary emboli in the left lower lobe, unclear whether this is chronic or recurrent, favor recurrent. Other pulmonary emboli on prior exam have resolved. 2. Exam is technically limited. Critical Value/emergent results were called by telephone at the time of interpretation on 02/05/2017 at 1:10 am to Dr. Cy Blamer , who verbally acknowledged these results. Electronically Signed   By: Rubye Oaks M.D.   On: 02/05/2017 01:10      Assessment & Plan:    Active Problems:   Pulmonary embolism (HCC)   Hypertension    PE Heparin iv pharmacy to dose Check cardiac echo r/o RV strain Check hypercoag panel  Hypertension Labetalol 5mg  iv x1,  Irbesartan 150mg  po qday  DVT Prophylaxis Heparin -SCDs   AM Labs Ordered, also please review Full Orders  Family Communication: Admission, patients condition and plan of care including tests being ordered have been discussed with the patient  who indicate understanding and agree with the plan and Code Status.  Code Status FULLCODE  Likely DC to  home  Condition GUARDED    Consults called: none  Admission status: inpatient  Time spent in minutes : 45 minutes   Pearson Grippe M.D on 02/05/2017 at 2:46 AM  Between 7am to 7pm - Pager - [phone]  After 7pm go to [email or web address] - password [facility]  [facility] - Office  [phone]

## 2017-02-06 DIAGNOSIS — I2699 Other pulmonary embolism without acute cor pulmonale: Secondary | ICD-10-CM | POA: Diagnosis not present

## 2017-02-06 LAB — CARDIOLIPIN ANTIBODIES, IGG, IGM, IGA
Anticardiolipin IgA: <9 APL U/mL (ref 0–11)
Anticardiolipin IgG: <9 GPL U/mL (ref 0–14)
Anticardiolipin IgM: <9 MPL U/mL (ref 0–12)

## 2017-02-06 LAB — BETA-2-GLYCOPROTEIN I ABS, IGG/M/A
Beta-2 Glyco I IgG: <9 GPI IgG units (ref 0–20)
Beta-2-Glycoprotein I IgA: <9 GPI IgA units (ref 0–25)
Beta-2-Glycoprotein I IgM: <9 GPI IgM units (ref 0–32)

## 2017-02-06 LAB — HOMOCYSTEINE: Homocysteine: 12.6 umol/L (ref 0.0–15.0)

## 2017-02-06 MED ORDER — RIVAROXABAN (XARELTO) VTE STARTER PACK (15 & 20 MG): ORAL_TABLET | ORAL | 0 refills | Status: DC

## 2017-02-06 NOTE — Progress Notes (Signed)
Pt has orders to be discharged. Discharge instructions given and pt has no additional questions at this time. Medication regimen reviewed and pt educated. Pt verbalized understanding and has no additional questions. Telemetry box removed. IV removed and site in good condition. Pt stable and waiting for transportation.  Kazim Corrales RN 

## 2017-02-06 NOTE — Progress Notes (Signed)
CM talked to patient about hospital follow up; patient was seeing Dr Almetta LovelyKimbery Shelton for primary care but she is currently out of network with her insurance and she has been see a [facility] MD; patient is agreeable for my assistance in finding a PCP; Apt made with Dr Betty SwazilandJordan with [facility] for July 17,2018 at 10:30 am; information given to the patient. Abelino DerrickB Daaiel Starlin [facility] LPRN,MHA,BSN [phone]

## 2017-02-07 LAB — PROTEIN C, TOTAL: Protein C, Total: 98 % (ref 60–150)

## 2017-02-07 NOTE — Discharge Summary (Signed)
Physician Discharge Summary  Latasha Moore WJX:[id number] DOB: Aug 09, 1960 DOA: 02/04/2017  PCP: Patient, No Pcp Per  Admit date: 02/04/2017 Discharge date: 02/06/2017  Admitted From: Home.  Disposition: Home.   Recommendations for Outpatient Follow-up:  1. Follow up with PCP in 1-2 weeks 2. Please obtain BMP/CBC in one week 3. Please follow up  With PCP regarding hypercoagulable work up.     Discharge Condition:stable.  CODE STATUS: full code.  Diet recommendation: Heart Healthy  Brief/Interim Summary:  56 year old lady with h/o PE, DVT, family  H/o PE , comes in for sob, found to have PE, started onIV heparin changed  to xarelto.  Discharge Diagnoses:  Active Problems:   Pulmonary embolism (HCC)   Hypertension  Pulmonary embolism: Pt presents with sob, and CT angio of the chest shows segmental pulmonary embolism in the left lower lobe. Unclear if this is acute vs sub acute.  She was started on IV heparin and transitioned to oral xarelto on discharge.  Echo on admission shows grade 1 dysfunction and no right heart strain.    Discharge Instructions  Discharge Instructions    Diet - low sodium heart healthy    Complete by:  As directed    Discharge instructions    Complete by:  As directed    Please follow up with PCP in one week.     Allergies as of 02/06/2017      Reactions   Codeine Palpitations, Other (See Comments)   dizzy      Medication List    STOP taking these medications   diclofenac sodium 1 % Gel Commonly known as:  VOLTAREN   ibuprofen 200 MG tablet Commonly known as:  ADVIL,MOTRIN   meloxicam 15 MG tablet Commonly known as:  MOBIC     TAKE these medications   albuterol 108 (90 Base) MCG/ACT inhaler Commonly known as:  PROVENTIL HFA;VENTOLIN HFA Inhale 1-2 puffs into the lungs every 6 (six) hours as needed for wheezing or shortness of breath.   cetirizine-pseudoephedrine 5-120 MG tablet Commonly known as:  ZYRTEC-D Take 1 tablet by mouth  daily as needed for allergies.   montelukast 10 MG tablet Commonly known as:  SINGULAIR Take 10 mg by mouth daily as needed (allergies).   Rivaroxaban 15 & 20 MG Tbpk Take as directed on package: Start with one 15mg  tablet by mouth twice a day with food. On Day 22, switch to one 20mg  tablet once a day with food.   valsartan 160 MG tablet Commonly known as:  DIOVAN Take 160 mg by mouth daily.      Follow-up Information    Swaz[place]and, Betty G, MD Follow up on 02/12/2017.   Specialty:  Family Medicine Why:  Please try to keep your apt or call to reschedule Contact information: [address] [place] [facility] [place] [address] [phone]          Allergies  Allergen Reactions  . Codeine Palpitations and Other (See Comments)    dizzy    Consultations:  None.    Procedures/Studies: Dg Chest 2 View  Result Date: 02/04/2017 CLINICAL DATA:  Shortness of breath EXAM: CHEST  2 VIEW COMPARISON:  11/11/2014 FINDINGS: Normal heart size and mediastinal contours. No acute infiltrate or edema. No effusion or pneumothorax. No acute osseous findings. IMPRESSION: Negative chest. Electronically Signed   By: Marnee Spring M.D.   On: 02/04/2017 15:53   Ct Angio Chest Pe W And/or Wo Contrast  Result Date: 02/05/2017 CLINICAL DATA:  Shortness  of breath.  History of pulmonary embolus. EXAM: CT ANGIOGRAPHY CHEST WITH CONTRAST TECHNIQUE: Multidetector CT imaging of the chest was performed using the standard protocol during bolus administration of intravenous contrast. Multiplanar CT image reconstructions and MIPs were obtained to evaluate the vascular anatomy. CONTRAST:  100 cc Isovue 370 IV COMPARISON:  Radiograph earlier this day.  CT 11/11/2014 FINDINGS: Cardiovascular: There are probable filling defects in the segmental left lower lobe pulmonary arteries (series 8, image 123), unclear whether this represents residual chronic thrombus or recurrent thrombus as filling defects were previously seen in  this region. No other main or lobar pulmonary emboli, cannot assess more distal branches due to contrast bolus and soft tissue attenuation from body habitus. There is no right heart strain, RV to LV ratio of less than 1. The thoracic aorta is normal in caliber. The heart is normal in size. Mediastinum/Nodes: No pericardial effusion. No mediastinal or hilar adenopathy. The esophagus is decompressed. Lungs/Pleura: Minimal subsegmental atelectasis in the right lower lobe. The lungs are otherwise clear. No pulmonary edema or pleural fluid. Body habitus degrades image quality. Upper Abdomen: No acute abnormality. Musculoskeletal: There are no acute or suspicious osseous abnormalities. Degenerative change in the spine. Review of the MIP images confirms the above findings. IMPRESSION: 1. Findings suspicious for segmental pulmonary emboli in the left lower lobe, unclear whether this is chronic or recurrent, favor recurrent. Other pulmonary emboli on prior exam have resolved. 2. Exam is technically limited. Critical Value/emergent results were called by telephone at the time of interpretation on 02/05/2017 at 1:10 am to Dr. Cy Blamer , who verbally acknowledged these results. Electronically Signed   By: Rubye Oaks M.D.   On: 02/05/2017 01:10     Subjective: No chest pain or sob.   Discharge Exam: Vitals:   02/05/17 2359 02/06/17 0559  BP: 135/73 120/68  Pulse: (!) 102 99  Resp: 18 20  Temp: 98.5 F (36.9 C) 98.6 F (37 C)   Vitals:   02/05/17 1220 02/05/17 1953 02/05/17 2359 02/06/17 0559  BP: (!) 130/57 (!) 143/58 135/73 120/68  Pulse: 84 94 (!) 102 99  Resp: 18 18 18 20   Temp: 98.4 F (36.9 C) 98 F (36.7 C) 98.5 F (36.9 C) 98.6 F (37 C)  TempSrc: Oral Oral Oral Oral  SpO2: 99% 100% 98% 98%  Weight:    (!) 210.1 kg (463 lb 1.6 oz)  Height:        General: Pt is alert, awake, not in acute distress Cardiovascular: RRR, S1/S2 +, no rubs, no gallops Respiratory: CTA bilaterally, no  wheezing, no rhonchi Abdominal: Soft, NT, ND, bowel sounds + Extremities: no edema, no cyanosis    The results of significant diagnostics from this hospitalization (including imaging, microbiology, ancillary and laboratory) are listed below for reference.     Microbiology: No results found for this or any previous visit (from the past 240 hour(s)).   Labs: BNP (last 3 results) No results for input(s): BNP in the last 8760 hours. Basic Metabolic Panel:  Recent Labs Lab 02/04/17 1700 02/05/17 0532  NA 137 139  K 3.9 3.5  CL 104 105  CO2 21* 24  GLUCOSE 93 91  BUN 15 14  CREATININE 0.95 0.94  CALCIUM 9.5 9.0   Liver Function Tests:  Recent Labs Lab 02/05/17 0532  AST 28  ALT 28  ALKPHOS 73  BILITOT 0.8  PROT 7.3  ALBUMIN 3.6   No results for input(s): LIPASE, AMYLASE in the last 168  hours. No results for input(s): AMMONIA in the last 168 hours. CBC:  Recent Labs Lab 02/04/17 1631 02/05/17 0532  WBC 9.2 9.9  HGB 14.9 12.8  HCT 45.2 40.0  MCV 92.4 92.6  PLT 320 294   Cardiac Enzymes: No results for input(s): CKTOTAL, CKMB, CKMBINDEX, TROPONINI in the last 168 hours. BNP: Invalid input(s): POCBNP CBG: No results for input(s): GLUCAP in the last 168 hours. D-Dimer  Recent Labs  02/04/17 1631  DDIMER 1.33*   Hgb A1c No results for input(s): HGBA1C in the last 72 hours. Lipid Profile No results for input(s): CHOL, HDL, LDLCALC, TRIG, CHOLHDL, LDLDIRECT in the last 72 hours. Thyroid function studies No results for input(s): TSH, T4TOTAL, T3FREE, THYROIDAB in the last 72 hours.  Invalid input(s): FREET3 Anemia work up No results for input(s): VITAMINB12, FOLATE, FERRITIN, TIBC, IRON, RETICCTPCT in the last 72 hours. Urinalysis No results found for: COLORURINE, APPEARANCEUR, LABSPEC, PHURINE, GLUCOSEU, HGBUR, BILIRUBINUR, KETONESUR, PROTEINUR, UROBILINOGEN, NITRITE, LEUKOCYTESUR Sepsis Labs Invalid input(s): PROCALCITONIN,  WBC,   LACTICIDVEN Microbiology No results found for this or any previous visit (from the past 240 hour(s)).   Time coordinating discharge: Over 30 minutes  SIGNED:   Kathlen ModyAKULA,Avonte Sensabaugh, MD  [facility] 02/07/2017, 10:15 AM Pager   If 7PM-7AM, please contact night-coverage [email or web address] Password [email or web address]

## 2017-02-08 LAB — PROTEIN C ACTIVITY: Protein C Activity: 111 % (ref 73–180)

## 2017-02-08 LAB — LUPUS ANTICOAGULANT PANEL
DRVVT: 41.8 s (ref 0.0–47.0)
PTT Lupus Anticoagulant: 39.4 s (ref 0.0–51.9)

## 2017-02-08 LAB — PROTEIN S, TOTAL: Protein S Ag, Total: 94 % (ref 60–150)

## 2017-02-08 LAB — PROTEIN S ACTIVITY: PROTEIN S ACTIVITY: 81 % (ref 63–140)

## 2017-02-12 ENCOUNTER — Encounter: Payer: Self-pay | Admitting: Family Medicine

## 2017-02-12 ENCOUNTER — Ambulatory Visit (INDEPENDENT_AMBULATORY_CARE_PROVIDER_SITE_OTHER): Payer: 59 | Admitting: Family Medicine

## 2017-02-12 VITALS — BP 146/90 | HR 107 | Resp 12 | Ht 70.0 in | Wt >= 6400 oz

## 2017-02-12 DIAGNOSIS — J309 Allergic rhinitis, unspecified: Secondary | ICD-10-CM

## 2017-02-12 DIAGNOSIS — I10 Essential (primary) hypertension: Secondary | ICD-10-CM | POA: Diagnosis not present

## 2017-02-12 DIAGNOSIS — J3089 Other allergic rhinitis: Secondary | ICD-10-CM | POA: Insufficient documentation

## 2017-02-12 DIAGNOSIS — M170 Bilateral primary osteoarthritis of knee: Secondary | ICD-10-CM | POA: Diagnosis not present

## 2017-02-12 DIAGNOSIS — M179 Osteoarthritis of knee, unspecified: Secondary | ICD-10-CM | POA: Insufficient documentation

## 2017-02-12 DIAGNOSIS — E669 Obesity, unspecified: Secondary | ICD-10-CM

## 2017-02-12 DIAGNOSIS — Z6841 Body Mass Index (BMI) 40.0 and over, adult: Secondary | ICD-10-CM | POA: Diagnosis not present

## 2017-02-12 DIAGNOSIS — M1710 Unilateral primary osteoarthritis, unspecified knee: Secondary | ICD-10-CM | POA: Insufficient documentation

## 2017-02-12 DIAGNOSIS — I2699 Other pulmonary embolism without acute cor pulmonale: Secondary | ICD-10-CM

## 2017-02-12 DIAGNOSIS — IMO0001 Reserved for inherently not codable concepts without codable children: Secondary | ICD-10-CM

## 2017-02-12 LAB — CBC
HCT: 42.5 % (ref 36.0–46.0)
HEMOGLOBIN: 14.2 g/dL (ref 12.0–15.0)
MCHC: 33.4 g/dL (ref 30.0–36.0)
MCV: 92.4 fl (ref 78.0–100.0)
PLATELETS: 324 10*3/uL (ref 150.0–400.0)
RBC: 4.6 Mil/uL (ref 3.87–5.11)
RDW: 14.4 % (ref 11.5–15.5)
WBC: 9.5 10*3/uL (ref 4.0–10.5)

## 2017-02-12 LAB — BASIC METABOLIC PANEL
BUN: 20 mg/dL (ref 6–23)
CHLORIDE: 104 meq/L (ref 96–112)
CO2: 25 meq/L (ref 19–32)
CREATININE: 1.06 mg/dL (ref 0.40–1.20)
Calcium: 9.7 mg/dL (ref 8.4–10.5)
GFR: 69.12 mL/min (ref >60.00)
GLUCOSE: 102 mg/dL — AB (ref 70–99)
POTASSIUM: 4.2 meq/L (ref 3.5–5.1)
Sodium: 139 mEq/L (ref 135–145)

## 2017-02-12 LAB — PROTHROMBIN GENE MUTATION

## 2017-02-12 LAB — FACTOR 5 LEIDEN

## 2017-02-12 MED ORDER — DICLOFENAC SODIUM 1 % TD GEL: 4.0000 g | Freq: Four times a day (QID) | TRANSDERMAL | 3 refills | Status: DC

## 2017-02-12 MED ORDER — RIVAROXABAN 20 MG PO TABS: 20.0000 mg | ORAL_TABLET | Freq: Every day | ORAL | 1 refills | Status: DC

## 2017-02-12 MED ORDER — AMLODIPINE BESYLATE 5 MG PO TABS: 5.0000 mg | ORAL_TABLET | Freq: Every day | ORAL | 1 refills | Status: DC

## 2017-02-12 NOTE — Progress Notes (Signed)
HPI:   [name] is a 56 y.o. female, who is here today to establish care.  Former PCP: Dr Renae Gloss Last preventive routine visit: 09/2015.    Chronic medical problems: PE, HTN, allergic rhinitis.  HTN Dx a few years ago. She does not check BP at home. Denies severe/frequent headache, visual changes, chest pain, palpitation, claudication,or focal weakness. Currently on Diovan 160 mg daily. She is trying to follow a low salt diet.  She was recently hospitalized, from 02/04/2017 to 02/06/2017 due to pulmonary embolism. She had another PE about 3 years ago and at that time it was bilateral.   She presented to the ER complaining of dyspnea.   Lab Results  Component Value Date   WBC 9.9 02/05/2017   HGB 12.8 02/05/2017   HCT 40.0 02/05/2017   MCV 92.6 02/05/2017   PLT 294 02/05/2017   Lab Results  Component Value Date   CREATININE 0.94 02/05/2017   BUN 14 02/05/2017   NA 139 02/05/2017   K 3.5 02/05/2017   CL 105 02/05/2017   CO2 24 02/05/2017   She is currently on Xorelto 15 mg bid.  Echo showed grade 1 diastolic dysfunction.  Fatigue has improved some. Exertional dyspnea. Cough "not bad", coughing up mucus, which seems to be from post nasal drainage. No wheezing.  Concerns today:  Allergic rhinitis: She is on Zyrtec D, Singulair 10 m. She has not had nasal steroid. + Nasal saline as needed. It has ben worse for the past year. Recurrent "sinus infections" 20 years ago she followed with immunologists.   She tries to follow a healthy, avoid dairy product. She has not followed a healthy diet consistently. She does not exercise because knee pain, bilateral. Lower back surgery, no "bad" pain since she had surgery. She has lost wt a few times before when she has followed a healthy "natural" diet consistently, up to 120 pounds.   She has followed with ortho, knee injections "stopped" helping. She was on Mobic until hospitalization. Taking  Ibuprofen prn.    Review of Systems  Constitutional: Positive for fatigue. Negative for activity change, appetite change, fever and unexpected weight change.  HENT: Positive for congestion, postnasal drip, rhinorrhea and sneezing. Negative for facial swelling, mouth sores, nosebleeds, sore throat, trouble swallowing and voice change.   Eyes: Negative for redness and visual disturbance.  Respiratory: Positive for cough and shortness of breath. Negative for wheezing.   Cardiovascular: Negative for chest pain, palpitations and leg swelling.  Gastrointestinal: Negative for abdominal pain, nausea and vomiting.       Negative for changes in bowel habits.  Endocrine: Negative for cold intolerance, heat intolerance, polydipsia, polyphagia and polyuria.  Genitourinary: Negative for decreased urine volume, dysuria and hematuria.  Musculoskeletal: Positive for arthralgias, gait problem and joint swelling.  Skin: Negative for rash.  Allergic/Immunologic: Positive for environmental allergies.  Neurological: Negative for syncope, weakness and headaches.  Hematological: Negative for adenopathy. Does not bruise/bleed easily.  Psychiatric/Behavioral: Negative for confusion. The patient is not nervous/anxious.      Current Outpatient Prescriptions on File Prior to Visit  Medication Sig Dispense Refill  . albuterol (PROVENTIL HFA;VENTOLIN HFA) 108 (90 Base) MCG/ACT inhaler Inhale 1-2 puffs into the lungs every 6 (six) hours as needed for wheezing or shortness of breath.    . cetirizine-pseudoephedrine (ZYRTEC-D) 5-120 MG tablet Take 1 tablet by mouth daily as needed for allergies.    . montelukast (SINGULAIR) 10 MG tablet Take 10 mg  by mouth daily as needed (allergies).    . Rivaroxaban 15 & 20 MG TBPK Take as directed on package: Start with one 15mg  tablet by mouth twice a day with food. On Day 22, switch to one 20mg  tablet once a day with food. 51 each 0  . valsartan (DIOVAN) 160 MG tablet Take 160 mg by  mouth daily.     No current facility-administered medications on file prior to visit.      Past Medical History:  Diagnosis Date  . Allergy   . Arthritis   . Depression   . DVT (deep venous thrombosis) (HCC)   . Hyperlipidemia   . Hypertension   . Pulmonary emboli (HCC)    Allergies  Allergen Reactions  . Codeine Palpitations and Other (See Comments)    dizzy    Family History  Problem Relation Age of Onset  . Hypertension Mother   . Sleep apnea Brother   . Deep vein thrombosis Brother   . Diabetes Neg Hx   . Cancer Neg Hx     Social History   Social History  . Marital status: Single    Spouse name: N/A  . Number of children: N/A  . Years of education: N/A   Social History Main Topics  . Smoking status: Former Games developermoker  . Smokeless tobacco: Never Used  . Alcohol use No  . Drug use: No  . Sexual activity: Not Asked   Other Topics Concern  . None   Social History Narrative  . None    Vitals:   02/12/17 1030  BP: (!) 146/90  Pulse: (!) 107  Resp: 12   O2 sat at RA 98% Body mass index is 66.43 kg/m.   Physical Exam  Nursing note and vitals reviewed. Constitutional: She is oriented to person, place, and time. She appears well-developed. No distress.  HENT:  Head: Atraumatic.  Mouth/Throat: Oropharynx is clear and moist and mucous membranes are normal.  Eyes: Pupils are equal, round, and reactive to light. Conjunctivae and EOM are normal.  Neck: No tracheal deviation present. No thyroid mass and no thyromegaly present.  Cardiovascular: Regular rhythm.  Tachycardia present.   No murmur heard. Pulses:      Dorsalis pedis pulses are 2+ on the right side, and 2+ on the left side.  Respiratory: Effort normal and breath sounds normal. No respiratory distress.  GI: Soft. There is no hepatomegaly. There is no tenderness.  Musculoskeletal: She exhibits edema (Trace pitting LE edema, bilateral.). She exhibits no tenderness.       Thoracic back: She  exhibits no tenderness and no bony tenderness.       Lumbar back: She exhibits no tenderness and no bony tenderness.  Antalgic gait, she is not able to get on exam table for knee examination.  Lymphadenopathy:    She has no cervical adenopathy.  Neurological: She is alert and oriented to person, place, and time. She has normal strength. Coordination normal.  Skin: Skin is warm. No erythema.  Psychiatric: She has a normal mood and affect.  Well groomed, good eye contact.     ASSESSMENT AND PLAN:   Ms. Gunnar Fusiaula was seen today for establish care.  Diagnoses and all orders for this visit:  Lab Results  Component Value Date   CREATININE 1.06 02/12/2017   BUN 20 02/12/2017   NA 139 02/12/2017   K 4.2 02/12/2017   CL 104 02/12/2017   CO2 25 02/12/2017   Lab Results  Component Value Date  WBC 9.5 02/12/2017   HGB 14.2 02/12/2017   HCT 42.5 02/12/2017   MCV 92.4 02/12/2017   PLT 324.0 02/12/2017    Hypertension, essential, benign  Not well controlled. Possible complications of elevated BP discussed. Amlodipine 5 mg at bedtime added. Monitor BP at home. No changes in Diovan. Annual eye examination. F/U in 6 weeks.  -     amLODipine (NORVASC) 5 MG tablet; Take 1 tablet (5 mg total) by mouth daily. -     Basic metabolic panel  Allergic rhinitis, unspecified seasonality, unspecified trigger  Moderate to severe, has tried several treatments but still symptomatic. Nasal irrigations with saline. Caution of decongestants. Continue Singulair 10 mg. Immunology referral placed.  -     Ambulatory referral to Immunology  Other pulmonary embolism without acute cor pulmonale, unspecified chronicity (HCC)  Complete Xarelto 15 mg bid and continue Xarelto 20 mg daily and continue life time. Some side effects of medication discussed.  -     CBC -     rivaroxaban (XARELTO) 20 MG TABS tablet; Take 1 tablet (20 mg total) by mouth daily with supper.  Osteoarthritis of both knees,  unspecified osteoarthritis type  She is not interested in ortho evaluation because knee injections were nit helping and she is not interested in surgical procedures. Wt loss may help. Topical Diclofenac recommended. OTC Tylenol 500 mg tid as needed.  -     diclofenac sodium (VOLTAREN) 1 % GEL; Apply 4 g topically 4 (four) times daily.  Class 3 obesity with serious comorbidity and body mass index (BMI) of 60.0 to 69.9 in adult, unspecified obesity type (HCC)  We discussed benefits of wt loss as well as adverse effects of obesity. Consistency with healthy diet and physical activity recommended. Daily brisk walking for 15-30 min as tolerated.    Betty G. [place], MD  [facility]. [place] office.

## 2017-02-12 NOTE — Patient Instructions (Signed)
A few things to remember from today's visit:   Hypertension, essential, benign - Plan: amLODipine (NORVASC) 5 MG tablet, Basic metabolic panel  Allergic rhinitis, unspecified seasonality, unspecified trigger - Plan: Ambulatory referral to Immunology  Other pulmonary embolism without acute cor pulmonale, unspecified chronicity (HCC) - Plan: CBC  Osteoarthritis of both knees, unspecified osteoarthritis type - Plan: diclofenac sodium (VOLTAREN) 1 % GEL   Blood pressure goal for most people is less than 140/90.   Most recent cardiologists' recommendations recommend blood pressure at or less than 130/80.   Elevated blood pressure increases the risk of strokes, heart and kidney disease, and eye problems. Regular physical activity and a healthy diet (DASH diet) usually help. Low salt diet. Take medications as instructed.  Caution with some over the counter medications as cold medications, dietary products (for weight loss), and Ibuprofen or Aleve (frequent use);all these medications could cause elevation of blood pressure.    DASH Eating Plan DASH stands for "Dietary Approaches to Stop Hypertension." The DASH eating plan is a healthy eating plan that has been shown to reduce high blood pressure (hypertension). It may also reduce your risk for type 2 diabetes, heart disease, and stroke. The DASH eating plan may also help with weight loss. What are tips for following this plan? General guidelines  Avoid eating more than 2,300 mg (milligrams) of salt (sodium) a day. If you have hypertension, you may need to reduce your sodium intake to 1,500 mg a day.  Limit alcohol intake to no more than 1 drink a day for nonpregnant women and 2 drinks a day for men. One drink equals 12 oz of beer, 5 oz of wine, or 1 oz of hard liquor.  Work with your health care provider to maintain a healthy body weight or to lose weight. Ask what an ideal weight is for you.  Get at least 30 minutes of exercise that  causes your heart to beat faster (aerobic exercise) most days of the week. Activities may include walking, swimming, or biking.  Work with your health care provider or diet and nutrition specialist (dietitian) to adjust your eating plan to your individual calorie needs. Reading food labels  Check food labels for the amount of sodium per serving. Choose foods with less than 5 percent of the Daily Value of sodium. Generally, foods with less than 300 mg of sodium per serving fit into this eating plan.  To find whole grains, look for the word "whole" as the first word in the ingredient list. Shopping  Buy products labeled as "low-sodium" or "no salt added."  Buy fresh foods. Avoid canned foods and premade or frozen meals. Cooking  Avoid adding salt when cooking. Use salt-free seasonings or herbs instead of table salt or sea salt. Check with your health care provider or pharmacist before using salt substitutes.  Do not fry foods. Cook foods using healthy methods such as baking, boiling, grilling, and broiling instead.  Cook with heart-healthy oils, such as olive, canola, soybean, or sunflower oil. Meal planning   Eat a balanced diet that includes: ? 5 or more servings of fruits and vegetables each day. At each meal, try to fill half of your plate with fruits and vegetables. ? Up to 6-8 servings of whole grains each day. ? Less than 6 oz of lean meat, poultry, or fish each day. A 3-oz serving of meat is about the same size as a deck of cards. One egg equals 1 oz. ? 2 servings of low-fat dairy  each day. ? A serving of nuts, seeds, or beans 5 times each week. ? Heart-healthy fats. Healthy fats called Omega-3 fatty acids are found in foods such as flaxseeds and coldwater fish, like sardines, salmon, and mackerel.  Limit how much you eat of the following: ? Canned or prepackaged foods. ? Food that is high in trans fat, such as fried foods. ? Food that is high in saturated fat, such as fatty  meat. ? Sweets, desserts, sugary drinks, and other foods with added sugar. ? Full-fat dairy products.  Do not salt foods before eating.  Try to eat at least 2 vegetarian meals each week.  Eat more home-cooked food and less restaurant, buffet, and fast food.  When eating at a restaurant, ask that your food be prepared with less salt or no salt, if possible. What foods are recommended? The items listed may not be a complete list. Talk with your dietitian about what dietary choices are best for you. Grains Whole-grain or whole-wheat bread. Whole-grain or whole-wheat pasta. Brown rice. Orpah Cobb. Bulgur. Whole-grain and low-sodium cereals. Pita bread. Low-fat, low-sodium crackers. Whole-wheat flour tortillas. Vegetables Fresh or frozen vegetables (raw, steamed, roasted, or grilled). Low-sodium or reduced-sodium tomato and vegetable juice. Low-sodium or reduced-sodium tomato sauce and tomato paste. Low-sodium or reduced-sodium canned vegetables. Fruits All fresh, dried, or frozen fruit. Canned fruit in natural juice (without added sugar). Meat and other protein foods Skinless chicken or [place]. Ground chicken or [place]. Pork with fat trimmed off. Fish and seafood. Egg whites. Dried beans, peas, or lentils. Unsalted nuts, nut butters, and seeds. Unsalted canned beans. Lean cuts of beef with fat trimmed off. Low-sodium, lean deli meat. Dairy Low-fat (1%) or fat-free (skim) milk. Fat-free, low-fat, or reduced-fat cheeses. Nonfat, low-sodium ricotta or cottage cheese. Low-fat or nonfat yogurt. Low-fat, low-sodium cheese. Fats and oils Soft margarine without trans fats. Vegetable oil. Low-fat, reduced-fat, or light mayonnaise and salad dressings (reduced-sodium). Canola, safflower, olive, soybean, and sunflower oils. Avocado. Seasoning and other foods Herbs. Spices. Seasoning mixes without salt. Unsalted popcorn and pretzels. Fat-free sweets. What foods are not recommended? The items listed  may not be a complete list. Talk with your dietitian about what dietary choices are best for you. Grains Baked goods made with fat, such as croissants, muffins, or some breads. Dry pasta or rice meal packs. Vegetables Creamed or fried vegetables. Vegetables in a cheese sauce. Regular canned vegetables (not low-sodium or reduced-sodium). Regular canned tomato sauce and paste (not low-sodium or reduced-sodium). Regular tomato and vegetable juice (not low-sodium or reduced-sodium). Rosita Fire. Olives. Fruits Canned fruit in a light or heavy syrup. Fried fruit. Fruit in cream or butter sauce. Meat and other protein foods Fatty cuts of meat. Ribs. Fried meat. Tomasa Blase. Sausage. Bologna and other processed lunch meats. Salami. Fatback. Hotdogs. Bratwurst. Salted nuts and seeds. Canned beans with added salt. Canned or smoked fish. Whole eggs or egg yolks. Chicken or [place] with skin. Dairy Whole or 2% milk, cream, and half-and-half. Whole or full-fat cream cheese. Whole-fat or sweetened yogurt. Full-fat cheese. Nondairy creamers. Whipped toppings. Processed cheese and cheese spreads. Fats and oils Butter. Stick margarine. Lard. Shortening. Ghee. Bacon fat. Tropical oils, such as coconut, palm kernel, or palm oil. Seasoning and other foods Salted popcorn and pretzels. Onion salt, garlic salt, seasoned salt, table salt, and sea salt. Worcestershire sauce. Tartar sauce. Barbecue sauce. Teriyaki sauce. Soy sauce, including reduced-sodium. Steak sauce. Canned and packaged gravies. Fish sauce. Oyster sauce. Cocktail sauce. Horseradish that you find on  the shelf. Ketchup. Mustard. Meat flavorings and tenderizers. Bouillon cubes. Hot sauce and Tabasco sauce. Premade or packaged marinades. Premade or packaged taco seasonings. Relishes. Regular salad dressings. Where to find more information:  National Heart, Lung, and Blood Institute: PopSteam.is  American Heart Association: www.heart.org Summary  The DASH  eating plan is a healthy eating plan that has been shown to reduce high blood pressure (hypertension). It may also reduce your risk for type 2 diabetes, heart disease, and stroke.  With the DASH eating plan, you should limit salt (sodium) intake to 2,300 mg a day. If you have hypertension, you may need to reduce your sodium intake to 1,500 mg a day.  When on the DASH eating plan, aim to eat more fresh fruits and vegetables, whole grains, lean proteins, low-fat dairy, and heart-healthy fats.  Work with your health care provider or diet and nutrition specialist (dietitian) to adjust your eating plan to your individual calorie needs. This information is not intended to replace advice given to you by your health care provider. Make sure you discuss any questions you have with your health care provider. Document Released: 07/05/2011 Document Revised: 07/09/2016 Document Reviewed: 07/09/2016 Elsevier Interactive Patient Education  2017 [employer].   Please be sure medication list is accurate. If a new problem present, please set up appointment sooner than planned today.

## 2017-02-17 ENCOUNTER — Encounter: Payer: Self-pay | Admitting: Family Medicine

## 2017-03-13 ENCOUNTER — Encounter: Payer: Self-pay | Admitting: Family Medicine

## 2017-03-18 ENCOUNTER — Other Ambulatory Visit: Payer: Self-pay

## 2017-03-18 MED ORDER — OLMESARTAN MEDOXOMIL 20 MG PO TABS: 20.0000 mg | ORAL_TABLET | Freq: Every day | ORAL | 3 refills | Status: DC

## 2017-03-20 ENCOUNTER — Other Ambulatory Visit: Payer: Self-pay

## 2017-03-20 MED ORDER — VALSARTAN 160 MG PO TABS: 160.0000 mg | ORAL_TABLET | Freq: Every day | ORAL | 1 refills | Status: DC

## 2017-03-20 NOTE — Telephone Encounter (Signed)
Error ss

## 2017-03-25 NOTE — Progress Notes (Signed)
HPI:   Latasha Moore is a 56 y.o. female, who is here today to discuss anxiety.   She was seen on 02/12/17. On 03/11/17 she was seen at [facility] for anxiety and depression, neither one she reported last OV. She is following with psychotherapists every 2 weeks, next appointment is today.  She has been suffering from depression and anxiety since college, it has been intermittently. She has tried pharmacologic treatment in the past, "for a few months" at the time, and discontinued when she was feeling better. She has tried Prozac and Citalopram, the latter one didn't seem to help. She also tried Cymbalta, she didn't tolerate well, medication the make her feel "crazy."  Last time she was on medication was 2 years ago.  Problem seems to be exacerbated by health issues, including chronic pain and recent PE.  When she is in public and cannot move as fast as she would like to because of pain or when cannot do what she wants to do, she has episodes of acute anxiety. Very irritable at times, easily frustrated.  Symptoms have been worse for the past year, after the death of her grandmother. She lives alone.  For the past year she has not been as involved in church as she was before.No motivation and oversleeping.  She has friends in the area, who she can call if she has a problem. They also check on her if she doesn't call them in a couple days.  She denies suicidal thoughts.   Review of Systems  Constitutional: Positive for fatigue. Negative for activity change and appetite change.  Respiratory: Negative for chest tightness, shortness of breath and wheezing.   Cardiovascular: Negative for chest pain and palpitations.  Gastrointestinal: Negative for abdominal pain, nausea and vomiting.       No changes in bowel habits.  Endocrine: Negative for cold intolerance and heat intolerance.  Musculoskeletal: Positive for arthralgias, back pain and gait problem.  Skin:  Negative for rash.  Neurological: Negative for dizziness, seizures, syncope and headaches.  Psychiatric/Behavioral: Negative for confusion, hallucinations, sleep disturbance and suicidal ideas. The patient is nervous/anxious.       Current Outpatient Prescriptions on File Prior to Visit  Medication Sig Dispense Refill  . albuterol (PROVENTIL HFA;VENTOLIN HFA) 108 (90 Base) MCG/ACT inhaler Inhale 1-2 puffs into the lungs every 6 (six) hours as needed for wheezing or shortness of breath.    Marland Kitchen amLODipine (NORVASC) 5 MG tablet Take 1 tablet (5 mg total) by mouth daily. 90 tablet 1  . cetirizine-pseudoephedrine (ZYRTEC-D) 5-120 MG tablet Take 1 tablet by mouth daily as needed for allergies.    Marland Kitchen diclofenac sodium (VOLTAREN) 1 % GEL Apply 4 g topically 4 (four) times daily. 4 Tube 3  . montelukast (SINGULAIR) 10 MG tablet Take 10 mg by mouth daily as needed (allergies).    . rivaroxaban (XARELTO) 20 MG TABS tablet Take 1 tablet (20 mg total) by mouth daily with supper. 90 tablet 1  . Rivaroxaban 15 & 20 MG TBPK Take as directed on package: Start with one 15mg  tablet by mouth twice a day with food. On Day 22, switch to one 20mg  tablet once a day with food. 51 each 0  . valsartan (DIOVAN) 160 MG tablet Take 1 tablet (160 mg total) by mouth daily. 90 tablet 1   No current facility-administered medications on file prior to visit.      Past Medical History:  Diagnosis Date  . Allergy   .  Arthritis   . Depression   . DVT (deep venous thrombosis) (HCC)   . Hyperlipidemia   . Hypertension   . Pulmonary emboli (HCC)    Allergies  Allergen Reactions  . Codeine Palpitations and Other (See Comments)    dizzy    Social History   Social History  . Marital status: Single    Spouse name: N/A  . Number of children: N/A  . Years of education: N/A   Social History Main Topics  . Smoking status: Former Games developer  . Smokeless tobacco: Never Used  . Alcohol use No  . Drug use: No  . Sexual  activity: Not Asked   Other Topics Concern  . None   Social History Narrative  . None    Vitals:   03/26/17 0809  BP: 122/78  Pulse: 86  Resp: 12  SpO2: 98%   Body mass index is 66.9 kg/m.   Physical Exam  Nursing note and vitals reviewed. Constitutional: She is oriented to person, place, and time. She appears well-developed. No distress.  HENT:  Head: Normocephalic.  Mouth/Throat: Oropharynx is clear and moist. Mucous membranes are dry.  Eyes: Conjunctivae are normal.  Cardiovascular: Normal rate and regular rhythm.   No murmur heard. Respiratory: Effort normal and breath sounds normal. No respiratory distress.  GI: Soft. There is no tenderness.  Musculoskeletal: She exhibits edema (Trace pitting LE edema, bilateral.).  Neurological: She is alert and oriented to person, place, and time. She has normal strength. Gait normal.  Stable gait assisted with a cane.  Skin: Skin is warm. No erythema.  Psychiatric: Her mood appears anxious. Her affect is labile. Cognition and memory are normal. She exhibits a depressed mood. She expresses no suicidal ideation. She expresses no suicidal plans.  Fairly groomed,adequate eye contact.     ASSESSMENT AND PLAN:   [name] was seen today for anxiety.  Diagnoses and all orders for this visit:  Class 3 obesity with serious comorbidity and body mass index (BMI) of 60.0 to 69.9 in adult, unspecified obesity type (HCC)  We discussed benefits of wt loss as well as adverse effects of obesity. Consistency with healthy diet and physical activity recommended. Because limitations due to chronic back and knee pain, recommended low impact exercise as tolerated. May consider adding Wellbutrin in the future.  Depression, major, recurrent, moderate (HCC)  After discussion of some pharmacologic treatment options as well as side effects, she would like to try Prozac again. Recommended starting with half tablet and increase it to the whole  tablet in 3-4 days if well-tolerated. Because she is following with psychotherapy every 2 weeks until I can see her back in 6-8 weeks, before if needed. A healthy diet and regular exercise might also help with symptoms. Also there are some observational studies about benefit of probiotics. Instructed about warning signs.  -     FLUoxetine (PROZAC) 20 MG tablet; Take 1 tablet (20 mg total) by mouth daily.  Anxiety disorder, unspecified type  Prozac started today. Continue following with psychotherapists. Follow-up in 6-8 weeks.  -     FLUoxetine (PROZAC) 20 MG tablet; Take 1 tablet (20 mg total) by mouth daily.     Blaine Guiffre G. [place], MD  [facility]. [place] office.

## 2017-03-26 ENCOUNTER — Ambulatory Visit (INDEPENDENT_AMBULATORY_CARE_PROVIDER_SITE_OTHER): Payer: 59 | Admitting: Family Medicine

## 2017-03-26 ENCOUNTER — Encounter: Payer: Self-pay | Admitting: Family Medicine

## 2017-03-26 VITALS — BP 122/78 | HR 86 | Resp 12 | Ht 70.0 in | Wt >= 6400 oz

## 2017-03-26 DIAGNOSIS — F331 Major depressive disorder, recurrent, moderate: Secondary | ICD-10-CM

## 2017-03-26 DIAGNOSIS — E669 Obesity, unspecified: Secondary | ICD-10-CM | POA: Diagnosis not present

## 2017-03-26 DIAGNOSIS — F324 Major depressive disorder, single episode, in partial remission: Secondary | ICD-10-CM | POA: Insufficient documentation

## 2017-03-26 DIAGNOSIS — F419 Anxiety disorder, unspecified: Secondary | ICD-10-CM | POA: Diagnosis not present

## 2017-03-26 DIAGNOSIS — Z6841 Body Mass Index (BMI) 40.0 and over, adult: Secondary | ICD-10-CM

## 2017-03-26 DIAGNOSIS — IMO0001 Reserved for inherently not codable concepts without codable children: Secondary | ICD-10-CM

## 2017-03-26 MED ORDER — FLUOXETINE HCL 20 MG PO TABS: 20.0000 mg | ORAL_TABLET | Freq: Every day | ORAL | 3 refills | Status: DC

## 2017-03-26 NOTE — Patient Instructions (Addendum)
A few things to remember from today's visit:   Depression, major, recurrent, moderate (HCC) - Plan: FLUoxetine (PROZAC) 20 MG tablet  Anxiety disorder, unspecified type - Plan: FLUoxetine (PROZAC) 20 MG tablet  Today we started Prozac (Fluoxetine), this type of medications can increase suicidal risk. This is more prevalent among children,adolecents, and young adults with major depression or other psychiatric disorders. It can also make depression worse. Most common side effects are gastrointestinal, self limited after a few weeks: diarrhea, nausea, constipation  Or diarrhea among some.   Start with half tablet for 3-4 days then increase it to the whole tolerated it well tolerated. In general it is well tolerated. We will follow closely.  Please continue following with psychotherapy. I will see her back in 6-8 weeks, before if needed.   Healthy diet and regular exercise might also help.  Please be sure medication list is accurate. If a new problem present, please set up appointment sooner than planned today.

## 2017-04-02 ENCOUNTER — Ambulatory Visit (INDEPENDENT_AMBULATORY_CARE_PROVIDER_SITE_OTHER): Payer: 59 | Admitting: Allergy and Immunology

## 2017-04-02 ENCOUNTER — Encounter: Payer: Self-pay | Admitting: Allergy and Immunology

## 2017-04-02 VITALS — BP 138/86 | HR 100 | Temp 98.5°F | Resp 20 | Ht 70.0 in | Wt >= 6400 oz

## 2017-04-02 DIAGNOSIS — J45909 Unspecified asthma, uncomplicated: Secondary | ICD-10-CM | POA: Diagnosis not present

## 2017-04-02 DIAGNOSIS — J329 Chronic sinusitis, unspecified: Secondary | ICD-10-CM | POA: Insufficient documentation

## 2017-04-02 DIAGNOSIS — J3089 Other allergic rhinitis: Secondary | ICD-10-CM | POA: Diagnosis not present

## 2017-04-02 DIAGNOSIS — J320 Chronic maxillary sinusitis: Secondary | ICD-10-CM

## 2017-04-02 MED ORDER — CARBINOXAMINE MALEATE 6 MG PO TABS: 6.0000 mg | ORAL_TABLET | Freq: Four times a day (QID) | ORAL | 3 refills | Status: DC | PRN

## 2017-04-02 MED ORDER — FLUTICASONE PROPIONATE 93 MCG/ACT NA EXHU: 2.0000 "application " | INHALANT_SUSPENSION | Freq: Two times a day (BID) | NASAL | 3 refills | Status: DC

## 2017-04-02 NOTE — Patient Instructions (Addendum)
Allergic rhinitis with probable nonallergic component  Aeroallergen avoidance measures have been discussed and provided in written form.  [facility], 2 actuations per nostril twice a day. Proper technique has been discussed and demonstrated.  Nasal saline lavage (NeilMed) has been recommended as needed and prior to medicated nasal sprays along with instructions for proper administration.  A prescription has been provided for RyVent (carbinoxamine maleate) 6mg  every 6-8 hours as needed.  If allergen avoidance measures and medications fail to adequately relieve symptoms, aeroallergen immunotherapy will be considered.  Allergic bronchitis  For now, continue montelukast 10 mg daily bedtime and albuterol HFA, 1-2 inhalations every 4-6 hours as needed.  Subjective and objective measures of pulmonary function will be followed and the treatment plan will be adjusted accordingly.   Return in about 3 months (around 07/02/2017), or if symptoms worsen or fail to improve.  Reducing Pollen Exposure  The American Academy of Allergy, Asthma and Immunology suggests the following steps to reduce your exposure to pollen during allergy seasons.    1. Do not hang sheets or clothing out to dry; pollen may collect on these items. 2. Do not mow lawns or spend time around freshly cut grass; mowing stirs up pollen. 3. Keep windows closed at night.  Keep car windows closed while driving. 4. Minimize morning activities outdoors, a time when pollen counts are usually at their highest. 5. Stay indoors as much as possible when pollen counts or humidity is high and on windy days when pollen tends to remain in the air longer. 6. Use air conditioning when possible.  Many air conditioners have filters that trap the pollen spores. 7. Use a HEPA room air filter to remove pollen form the indoor air you breathe.   Control of House Dust Mite Allergen  House dust mites play a major role in  allergic asthma and rhinitis.  They occur in environments with high humidity wherever human skin, the food for dust mites is found. High levels have been detected in dust obtained from mattresses, pillows, carpets, upholstered furniture, bed covers, clothes and soft toys.  The principal allergen of the house dust mite is found in its feces.  A gram of dust may contain 1,000 mites and 250,000 fecal particles.  Mite antigen is easily measured in the air during house cleaning activities.    1. Encase mattresses, including the box spring, and pillow, in an air tight cover.  Seal the zipper end of the encased mattresses with wide adhesive tape. 2. Wash the bedding in water of 130 degrees Farenheit weekly.  Avoid cotton comforters/quilts and flannel bedding: the most ideal bed covering is the dacron comforter. 3. Remove all upholstered furniture from the bedroom. 4. Remove carpets, carpet padding, rugs, and non-washable window drapes from the bedroom.  Wash drapes weekly or use plastic window coverings. 5. Remove all non-washable stuffed toys from the bedroom.  Wash stuffed toys weekly. 6. Have the room cleaned frequently with a vacuum cleaner and a damp dust-mop.  The patient should not be in a room which is being cleaned and should wait 1 hour after cleaning before going into the room. 7. Close and seal all heating outlets in the bedroom.  Otherwise, the room will become filled with dust-laden air.  An electric heater can be used to heat the room. 8. Reduce indoor humidity to less than 50%.  Do not use a humidifier.

## 2017-04-02 NOTE — Assessment & Plan Note (Signed)
   Aeroallergen avoidance measures have been discussed and provided in written form.  A [facility], 2 actuations per nostril twice a day. Proper technique has been discussed and demonstrated.  Nasal saline lavage (NeilMed) has been recommended as needed and prior to medicated nasal sprays along with instructions for proper administration.  A prescription has been provided for RyVent (carbinoxamine maleate) 6mg  every 6-8 hours as needed.  If allergen avoidance measures and medications fail to adequately relieve symptoms, aeroallergen immunotherapy will be considered.

## 2017-04-02 NOTE — Assessment & Plan Note (Signed)
   For now, continue montelukast 10 mg daily bedtime and albuterol HFA, 1-2 inhalations every 4-6 hours as needed.  Subjective and objective measures of pulmonary function will be followed and the treatment plan will be adjusted accordingly.

## 2017-04-02 NOTE — Progress Notes (Signed)
New Patient Note  RE: Bassheva Flury MRN: [id number] DOB: 09-11-1960 Date of Office Visit: 04/02/2017  Referring provider: [place], Betty G, MD Primary care provider: [place], Betty G, MD  Chief Complaint: Sinus Problem and Nasal Congestion   History of present illness: Latasha Moore is a 56 y.o. female seen today in consultation requested by Betty [place], MD.  She reports that she is to develop to "major" sinus infections per year, typically in the spring and in the fall.  However, over the past 12 months she has been treated with antibiotics and/or systemic steroids for sinus infections.  She complains of nasal congestion, sinus congestion, thick postnasal drainage, ear pressure (left greater than right).  The symptoms occur year around and tend to be triggered by pollen exposure, cigarette smoke, and certain strong aromas such as perfumes or scented candles.  She also experiences rhinorrhea, sneezing, nasal pruritus, and ocular pruritus.  These symptoms occur predominantly in late spring and early summer.  She reports that if her nasal and sinus symptoms are not adequately treated they will "turn into bronchitis."  She experiences dyspnea, chest tightness, and occasional wheezing with upper respiratory tract infections and hot/humid weather.  She has albuterol HFA which was prescribed for her approximately 2 years ago.   Assessment and plan: Allergic rhinitis with probable nonallergic component  Aeroallergen avoidance measures have been discussed and provided in written form.  A prescription has been provided for [facility], 2 actuations per nostril twice a day. Proper technique has been discussed and demonstrated.  Nasal saline lavage (NeilMed) has been recommended as needed and prior to medicated nasal sprays along with instructions for proper administration.  A prescription has been provided for RyVent (carbinoxamine maleate) 6mg  every 6-8 hours as needed.  If allergen  avoidance measures and medications fail to adequately relieve symptoms, aeroallergen immunotherapy will be considered.  Allergic bronchitis  For now, continue montelukast 10 mg daily bedtime and albuterol HFA, 1-2 inhalations every 4-6 hours as needed.  Subjective and objective measures of pulmonary function will be followed and the treatment plan will be adjusted accordingly.   Meds ordered this encounter  Medications  . Fluticasone Propionate (XHANCE) 93 MCG/ACT EXHU    Sig: Place 2 application into the nose 2 (two) times daily.    Dispense:  16 mL    Refill:  3  . Carbinoxamine Maleate (RYVENT) 6 MG TABS    Sig: Take 6 mg by mouth every 6 (six) hours as needed.    Dispense:  120 tablet    Refill:  3    Diagnostics: Spirometry:  Normal with an FEV1 of 91% predicted.  Please see scanned spirometry results for details. Epicutaneous testing: Positive to weed pollen, ragweed pollen, and tree pollen. Intradermal testing: Positive to dust mite antigen.   Physical examination: Blood pressure 138/86, pulse 100, temperature 98.5 F (36.9 C), resp. rate 20, height 5\' 10"  (1.778 m), weight (!) 459 lb 3.2 oz (208.3 kg), SpO2 94 %.  General: Alert, interactive, in no acute distress. HEENT: TMs pearly gray, turbinates edematous with thick discharge, post-pharynx moderately erythematous. Neck: Supple without lymphadenopathy. Lungs: Clear to auscultation without wheezing, rhonchi or rales. CV: Normal S1, S2 without murmurs. Abdomen: Nondistended, nontender. Skin: Warm and dry, without lesions or rashes. Extremities:  No clubbing, cyanosis or edema. Neuro:   Grossly intact.  Review of systems:  Review of systems negative except as noted in HPI / PMHx or noted below: Review of Systems  Constitutional: Negative.  HENT: Negative.   Eyes: Negative.   Respiratory: Negative.   Cardiovascular: Negative.   Gastrointestinal: Negative.   Genitourinary: Negative.   Musculoskeletal:  Negative.   Skin: Negative.   Neurological: Negative.   Endo/Heme/Allergies: Negative.   Psychiatric/Behavioral: Negative.     Past medical history:  Past Medical History:  Diagnosis Date  . Allergy   . Arthritis   . Depression   . DVT (deep venous thrombosis) (HCC)   . Hyperlipidemia   . Hypertension   . Pulmonary emboli [facility](HCC)     Past surgical history:  Past Surgical History:  Procedure Laterality Date  . ABDOMINAL HYSTERECTOMY    . BACK SURGERY    . CHOLECYSTECTOMY    . TONSILLECTOMY      Family history: Family History  Problem Relation Age of Onset  . Hypertension Mother   . Sleep apnea Brother   . Deep vein thrombosis Brother   . Diabetes Neg Hx   . Cancer Neg Hx     Social history: Social History   Social History  . Marital status: Single    Spouse name: N/A  . Number of children: N/A  . Years of education: N/A   Occupational History  . Not on file.   Social History Main Topics  . Smoking status: Former Games developermoker  . Smokeless tobacco: Never Used  . Alcohol use No  . Drug use: No  . Sexual activity: Not on file   Other Topics Concern  . Not on file   Social History Narrative  . No narrative on file   Environmental History: She lives in an apartment with carpeting throughout and central air/heat.  She is a nonsmoker without pets.  There is no known mold/water damage in the apartment.  Allergies as of 04/02/2017      Reactions   Cefdinir Diarrhea, Other (See Comments)   Other Reaction: N, V   Telithromycin Diarrhea, Other (See Comments)   Other Reaction: GI Upset-N, V   Codeine Palpitations, Other (See Comments)   dizzy      Medication List       Accurate as of 04/02/17  5:26 PM. Always use your most recent med list.          albuterol 108 (90 Base) MCG/ACT inhaler Commonly known as:  PROVENTIL HFA;VENTOLIN HFA Inhale 1-2 puffs into the lungs every 6 (six) hours as needed for wheezing or shortness of breath.   Carbinoxamine Maleate 6  MG Tabs Commonly known as:  RYVENT Take 6 mg by mouth every 6 (six) hours as needed.   cetirizine-pseudoephedrine 5-120 MG tablet Commonly known as:  ZYRTEC-D Take 1 tablet by mouth daily as needed for allergies.   diclofenac sodium 1 % Gel Commonly known as:  VOLTAREN Apply 4 g topically 4 (four) times daily.   FLUoxetine 20 MG tablet Commonly known as:  PROZAC Take 1 tablet (20 mg total) by mouth daily.   Fluticasone Propionate 93 MCG/ACT Exhu Commonly known as:  XHANCE Place 2 application into the nose 2 (two) times daily.   montelukast 10 MG tablet Commonly known as:  SINGULAIR Take 10 mg by mouth daily as needed (allergies).   Rivaroxaban 15 & 20 MG Tbpk Take as directed on package: Start with one 15mg  tablet by mouth twice a day with food. On Day 22, switch to one 20mg  tablet once a day with food.   rivaroxaban 20 MG Tabs tablet Commonly known as:  XARELTO Take 1 tablet (20 mg total) by  mouth daily with supper.   valsartan 160 MG tablet Commonly known as:  DIOVAN Take 1 tablet (160 mg total) by mouth daily.            Discharge Care Instructions        Start     Ordered   04/02/17 0000  Spirometry with Graph    Question Answer Comment  Where should this test be performed? Other   Basic spirometry Yes      04/02/17 1726   04/02/17 0000  Allergy Test    Question:  Allergy test to perform  Answer:  1-59   04/02/17 1726   04/02/17 0000  Interdermal Allergy Test    Question Answer Comment  Allergens Control   Allergens [place]   Allergens Johnson   Allergens 7 Grass   Allergens Mold 1   Allergens Mold 2   Allergens Mold 3   Allergens Mold 4   Allergens Cat   Allergens Dog   Allergens Cockroach   Allergens Mite Mix      04/02/17 1726   04/02/17 0000  Fluticasone Propionate (XHANCE) 93 MCG/ACT EXHU  2 times daily     04/02/17 1726   04/02/17 0000  Carbinoxamine Maleate (RYVENT) 6 MG TABS  Every 6 hours PRN     04/02/17 1726      Known  medication allergies: Allergies  Allergen Reactions  . Cefdinir Diarrhea and Other (See Comments)    Other Reaction: N, V  . Telithromycin Diarrhea and Other (See Comments)    Other Reaction: GI Upset-N, V  . Codeine Palpitations and Other (See Comments)    dizzy    I appreciate the opportunity to take part in Goldia's care. Please do not hesitate to contact me with questions.  Sincerely,   R. Jorene Guest, MD

## 2017-04-04 ENCOUNTER — Other Ambulatory Visit: Payer: Self-pay

## 2017-04-04 DIAGNOSIS — J320 Chronic maxillary sinusitis: Secondary | ICD-10-CM

## 2017-04-04 DIAGNOSIS — J3089 Other allergic rhinitis: Secondary | ICD-10-CM

## 2017-04-04 MED ORDER — CARBINOXAMINE MALEATE 6 MG PO TABS: 6.0000 mg | ORAL_TABLET | Freq: Every day | ORAL | 5 refills | Status: DC

## 2017-04-04 NOTE — Telephone Encounter (Signed)
[facility] Ryvent. I re-sent the script for Ryvent for 60 tab. If they send another PA back then we need to let the pharmacy know to do 30 tab cash pay with coupons codes.

## 2017-04-18 ENCOUNTER — Encounter: Payer: Self-pay | Admitting: Family Medicine

## 2017-05-27 ENCOUNTER — Telehealth: Payer: Self-pay | Admitting: Family Medicine

## 2017-05-27 DIAGNOSIS — F419 Anxiety disorder, unspecified: Secondary | ICD-10-CM

## 2017-05-27 DIAGNOSIS — F331 Major depressive disorder, recurrent, moderate: Secondary | ICD-10-CM

## 2017-05-27 NOTE — Telephone Encounter (Signed)
Left voicemail letting patient know she needed to make an appointment.

## 2017-05-27 NOTE — Telephone Encounter (Signed)
She is supposed to have 6-8 weeks follow up visit. Fluoxetine started about 2 months ago. I will send 30 days supply. Appt needed for follow up. Thanks, BJ

## 2017-05-27 NOTE — Telephone Encounter (Signed)
Pt has made the follow up appointment fro this refill. Can you please send in the 30 day to   [phone] [place] [place] [facility] [address] [facility] [address]  Pt's appt is 11/21 at 8:30 am

## 2017-05-28 NOTE — Telephone Encounter (Signed)
Rx sent to pharmacy on 05/27/17 by PCP.

## 2017-06-18 NOTE — Progress Notes (Signed)
HPI:   Latasha Moore is a 56 y.o. female, who is here today to follow on recent OV.   She was seen on March 26, 2017, when she was complaining of worsening anxiety and depressed mood.  Problem has been exacerbated by recent health issues. Last office visit she agreed with trying Fluoxetine 20 mg.   She has suffered from depression and anxiety since she was in college. In the past she has tried Citalopram and Cymbalta but did not help.  She had some GI symptoms when she first started Fluoxetine , cramps but resolved.  She is reporting great improvement of symptoms. Still under stress at work but dealing much better with it.  No changes in sleep, usually affected by respiratory symptoms (nasal congestion). Denies suicidal thoughts.  She has also noted that medication has helped with appetite. She is still not exercising regularly and skipping meals due to work schedule;so at night she eats late and overeat.  Congestion: She mentions that she was in the ER a few days ago because URI symptoms. She was treated with Prednisone. Still congested: Nasal and post nasal drainage. Hx of allergic rhinitis, she follows with immunologist, according to pt, she received samples of new medication that really helped, she was able to sleep through the night. Medication is not covered by her health insurance.  Fall and Spring weather changes exacerbate symptoms.  Occasional wheezing, which is exacerbated with exercise. She ran out of Albuterol inh. Denies fever,chills, or sick contact.  She is already on Zyrtec,Singulair,Flonase nasal spray, and nasal irrigations with saline.    Review of Systems  Constitutional: Positive for appetite change. Negative for activity change, chills, fatigue and fever.  HENT: Positive for congestion, postnasal drip and rhinorrhea. Negative for mouth sores, nosebleeds, sore throat and trouble swallowing.   Eyes: Negative for discharge and redness.   Respiratory: Positive for cough, shortness of breath and wheezing.   Cardiovascular: Negative for chest pain, palpitations and leg swelling.  Gastrointestinal: Negative for abdominal pain, diarrhea, nausea and vomiting.  Musculoskeletal: Positive for gait problem. Negative for myalgias.  Skin: Negative for rash.  Allergic/Immunologic: Positive for environmental allergies.  Neurological: Negative for syncope and headaches.  Hematological: Negative for adenopathy. Does not bruise/bleed easily.  Psychiatric/Behavioral: Positive for sleep disturbance (Due to nasal congestion). Negative for confusion, hallucinations and suicidal ideas. The patient is not nervous/anxious.       Current Outpatient Medications on File Prior to Visit  Medication Sig Dispense Refill  . Carbinoxamine Maleate (RYVENT) 6 MG TABS Take 6 mg by mouth daily. 1-2 times daily as needed 60 tablet 5  . cetirizine-pseudoephedrine (ZYRTEC-D) 5-120 MG tablet Take 1 tablet by mouth daily as needed for allergies.    Marland Kitchen. diclofenac sodium (VOLTAREN) 1 % GEL Apply 4 g topically 4 (four) times daily. 4 Tube 3  . Fluticasone Propionate (XHANCE) 93 MCG/ACT EXHU Place 2 application into the nose 2 (two) times daily. 16 mL 3  . montelukast (SINGULAIR) 10 MG tablet Take 10 mg by mouth daily as needed (allergies).    . rivaroxaban (XARELTO) 20 MG TABS tablet Take 1 tablet (20 mg total) by mouth daily with supper. 90 tablet 1  . Rivaroxaban 15 & 20 MG TBPK Take as directed on package: Start with one 15mg  tablet by mouth twice a day with food. On Day 22, switch to one 20mg  tablet once a day with food. 51 each 0  . valsartan (DIOVAN) 160 MG tablet Take  1 tablet (160 mg total) by mouth daily. 90 tablet 1   No current facility-administered medications on file prior to visit.      Past Medical History:  Diagnosis Date  . Allergy   . Arthritis   . Depression   . DVT (deep venous thrombosis) (HCC)   . Hyperlipidemia   . Hypertension   .  Pulmonary emboli (HCC)    Allergies  Allergen Reactions  . Cefdinir Diarrhea and Other (See Comments)    Other Reaction: N, V  . Telithromycin Diarrhea and Other (See Comments)    Other Reaction: GI Upset-N, V  . Codeine Palpitations and Other (See Comments)    dizzy    Social History   Socioeconomic History  . Marital status: Single    Spouse name: None  . Number of children: None  . Years of education: None  . Highest education level: None  Social Needs  . Financial resource strain: None  . Food insecurity - worry: None  . Food insecurity - inability: None  . Transportation needs - medical: None  . Transportation needs - non-medical: None  Occupational History  . None  Tobacco Use  . Smoking status: Former Games developermoker  . Smokeless tobacco: Never Used  Substance and Sexual Activity  . Alcohol use: No  . Drug use: No  . Sexual activity: None  Other Topics Concern  . None  Social History Narrative  . None    Vitals:   06/19/17 0840  BP: 132/78  Pulse: 66  Resp: 16  Temp: 98.6 F (37 C)  SpO2: 97%   Body mass index is 65.61 kg/m.   Wt Readings from Last 3 Encounters:  06/19/17 (!) 457 lb 4 oz (207.4 kg)  04/02/17 (!) 459 lb 3.2 oz (208.3 kg)  03/26/17 (!) 466 lb 4 oz (211.5 kg)     Physical Exam  Nursing note and vitals reviewed. Constitutional: She is oriented to person, place, and time. She appears well-developed. She does not appear ill. No distress.  HENT:  Head: Normocephalic and atraumatic.  Nose: Rhinorrhea present. Right sinus exhibits no maxillary sinus tenderness. Left sinus exhibits no maxillary sinus tenderness and no frontal sinus tenderness.  Mouth/Throat: Oropharynx is clear and moist and mucous membranes are normal.  Nasal voice. Hypertrophic turbinates.  Eyes: Conjunctivae are normal. Pupils are equal, round, and reactive to light.  Cardiovascular: Normal rate and regular rhythm.  No murmur heard. Pulses:      Dorsalis pedis pulses  are 2+ on the right side, and 2+ on the left side.  Respiratory: Effort normal and breath sounds normal. No respiratory distress.  Musculoskeletal: She exhibits edema (Trace pitting LE edema,bilateral.). She exhibits no tenderness.  Lymphadenopathy:    She has no cervical adenopathy.  Neurological: She is alert and oriented to person, place, and time. She has normal strength. Coordination normal.  Stable gait assisted with cane.  Skin: Skin is warm. No erythema.  Psychiatric: Her mood appears anxious.  Well groomed, good eye contact.    ASSESSMENT AND PLAN:   Latasha Moore was seen today for medication follow-up.  Diagnoses and all orders for this visit:  Exertional dyspnea  Mild and not acute. ?Asthma, obesity, OHS, diconditioning among some to consider. Albuterol inh 2 puff every 6 hours for a week then as needed for wheezing or shortness of breath or before exercise. Instructed about warning signs.  -     albuterol (PROVENTIL HFA;VENTOLIN HFA) 108 (90 Base) MCG/ACT inhaler; Inhale 1-2  puffs into the lungs every 6 (six) hours as needed for wheezing or shortness of breath.  Morbid obesity with BMI of 60.0-69.9, adult (HCC)  We discussed benefits of wt loss as well as adverse effects of obesity. Consistency with healthy diet and physical activity recommended. Daily brisk walking for 15-30 min as tolerated.  Anxiety disorder, unspecified type  Improved. No changes in current management. F/U in 3-4 months,before if needed.  -     FLUoxetine (PROZAC) 20 MG capsule; Take 1 capsule (20 mg total) by mouth daily.  Allergic rhinitis with probable nonallergic component  Not well controlled on current management. She would like to try Atrovent nasal spray. No changes in the rest of her medications. Continue following with immunologist.  -     ipratropium (ATROVENT) 0.03 % nasal spray; Place 2 sprays into both nostrils every 12 (twelve) hours.  Depression, major, recurrent,  moderate (HCC)  Improved No changes in current management. Instructed about warning signs. F/U in 3-4 months.  -     FLUoxetine (PROZAC) 20 MG capsule; Take 1 capsule (20 mg total) by mouth daily.     Latasha Mcinnis G. [place], MD  [facility]. [place] office.

## 2017-06-19 ENCOUNTER — Encounter: Payer: Self-pay | Admitting: Family Medicine

## 2017-06-19 ENCOUNTER — Ambulatory Visit: Payer: 59 | Admitting: Family Medicine

## 2017-06-19 VITALS — BP 132/78 | HR 66 | Temp 98.6°F | Resp 16 | Ht 70.0 in | Wt >= 6400 oz

## 2017-06-19 DIAGNOSIS — J3089 Other allergic rhinitis: Secondary | ICD-10-CM

## 2017-06-19 DIAGNOSIS — F419 Anxiety disorder, unspecified: Secondary | ICD-10-CM | POA: Diagnosis not present

## 2017-06-19 DIAGNOSIS — R0609 Other forms of dyspnea: Secondary | ICD-10-CM | POA: Diagnosis not present

## 2017-06-19 DIAGNOSIS — Z6841 Body Mass Index (BMI) 40.0 and over, adult: Secondary | ICD-10-CM

## 2017-06-19 DIAGNOSIS — R0600 Dyspnea, unspecified: Secondary | ICD-10-CM

## 2017-06-19 DIAGNOSIS — F331 Major depressive disorder, recurrent, moderate: Secondary | ICD-10-CM | POA: Diagnosis not present

## 2017-06-19 MED ORDER — ALBUTEROL SULFATE HFA 108 (90 BASE) MCG/ACT IN AERS: 1.0000 | INHALATION_SPRAY | Freq: Four times a day (QID) | RESPIRATORY_TRACT | 2 refills | Status: DC | PRN

## 2017-06-19 MED ORDER — FLUOXETINE HCL 20 MG PO CAPS: 20.0000 mg | ORAL_CAPSULE | Freq: Every day | ORAL | 1 refills | Status: DC

## 2017-06-19 MED ORDER — IPRATROPIUM BROMIDE 0.03 % NA SOLN: 2.0000 | Freq: Two times a day (BID) | NASAL | 1 refills | Status: DC

## 2017-06-19 NOTE — Patient Instructions (Addendum)
A few things to remember from today's visit:   Exertional dyspnea - Plan: albuterol (PROVENTIL HFA;VENTOLIN HFA) 108 (90 Base) MCG/ACT inhaler  Anxiety disorder, unspecified type  Morbid obesity with BMI of 60.0-69.9, adult (HCC)  Depression, major, recurrent, moderate (HCC)  Allergic rhinitis with probable nonallergic component - Plan: ipratropium (ATROVENT) 0.03 % nasal spray  There are 2 forms of allergic rhinitis: . Seasonal (hay fever): Caused by an allergy to pollen and/or mold spores in the air. Pollen is the fine powder that comes from the stamen of flowering plants. It can be carried through the air and is easily inhaled. Symptoms are seasonal and usually occur in spring, late summer, and fall. Marland Kitchen. Perennial: Caused by other allergens such as dust mites, pet hair or dander, or mold. Symptoms occur year-round.  Symptoms: Your symptoms can vary, depending on the severity of your allergies. Symptoms can include: Sneezing, coughing.itching (mostly eyes, nose, mouth, throat and skin),runny nose,stuffy nose.headache,pressure in the nose and cheeks,ear fullness and popping, sore throat.watery, red, or swollen eyes,dark circles under your eyes,trouble smelling, and sometimes hives.  Allergic rhinitis cannot be prevented. You can help your symptoms by avoiding the things that you are allergic, including: . Keeping windows closed. This is especially important during high-pollen seasons. . Washing your hands after petting animals. . Using dust- and mite-proof bedding and mattress covers. . Wearing glasses outside to protect your eyes. . Showering before bed to wash off allergens from hair and skin. You can also avoid things that can make your symptoms worse, such as: . aerosol sprays . air pollution . cold temperatures . humidity . irritating fumes . tobacco smoke . wind . wood smoke.   Antihistamines help reduce the sneezing, runny nose, and itchiness of allergies. These come in  pill form and as nasal sprays. Allegra,Zyrtec,or Claritin are some examples. Decongestants, such as pseudoephedrine and phenylephrine, help temporarily relieve the stuffy nose of allergies. Decongestants are found in many medicines and come as pills, nose sprays, and nose drops. They could increase heart rate and cause tachycardia and tremor. Nasal Afrin should not be used for more than 3 days because you can become dependent on them. This causes you to feel even more stopped-up when you try to quit using them.  Nasal sprays: steroids or antihistaminics. Over the counter intranasal sterids: Nasocort,Rhinocort,or Flonase.You won't notice their benefits for up to 2 weeks after starting them. Allergy shots or sublingual tablets when other treatment do not help.This is done by immunologists.   Please be sure medication list is accurate. If a new problem present, please set up appointment sooner than planned today.

## 2017-06-21 ENCOUNTER — Encounter: Payer: Self-pay | Admitting: Family Medicine

## 2017-07-14 ENCOUNTER — Encounter: Payer: Self-pay | Admitting: Family Medicine

## 2017-07-27 ENCOUNTER — Other Ambulatory Visit: Payer: Self-pay | Admitting: Family Medicine

## 2017-07-27 DIAGNOSIS — I2699 Other pulmonary embolism without acute cor pulmonale: Secondary | ICD-10-CM

## 2017-07-27 MED ORDER — RIVAROXABAN 20 MG PO TABS: 20.0000 mg | ORAL_TABLET | Freq: Every day | ORAL | 2 refills | Status: DC

## 2017-09-13 ENCOUNTER — Other Ambulatory Visit: Payer: Self-pay | Admitting: Family Medicine

## 2017-10-03 DIAGNOSIS — H6503 Acute serous otitis media, bilateral: Secondary | ICD-10-CM | POA: Diagnosis not present

## 2017-10-03 DIAGNOSIS — I10 Essential (primary) hypertension: Secondary | ICD-10-CM | POA: Diagnosis not present

## 2017-10-03 DIAGNOSIS — R0981 Nasal congestion: Secondary | ICD-10-CM | POA: Diagnosis not present

## 2017-10-03 DIAGNOSIS — J019 Acute sinusitis, unspecified: Secondary | ICD-10-CM | POA: Diagnosis not present

## 2017-11-26 DIAGNOSIS — M1711 Unilateral primary osteoarthritis, right knee: Secondary | ICD-10-CM | POA: Diagnosis not present

## 2017-11-26 DIAGNOSIS — M1712 Unilateral primary osteoarthritis, left knee: Secondary | ICD-10-CM | POA: Diagnosis not present

## 2017-12-02 ENCOUNTER — Encounter (HOSPITAL_COMMUNITY): Payer: Self-pay | Admitting: Emergency Medicine

## 2017-12-02 ENCOUNTER — Ambulatory Visit (HOSPITAL_COMMUNITY)
Admission: EM | Admit: 2017-12-02 | Discharge: 2017-12-02 | Disposition: A | Discharge status: Home / Self Care | Payer: BLUE CROSS/BLUE SHIELD | Attending: Family Medicine | Admitting: Family Medicine

## 2017-12-02 ENCOUNTER — Other Ambulatory Visit: Payer: Self-pay

## 2017-12-02 DIAGNOSIS — J0100 Acute maxillary sinusitis, unspecified: Secondary | ICD-10-CM | POA: Diagnosis not present

## 2017-12-02 MED ORDER — LEVOFLOXACIN 750 MG PO TABS: 750.0000 mg | ORAL_TABLET | Freq: Every day | ORAL | 0 refills | Status: DC

## 2017-12-02 NOTE — ED Triage Notes (Signed)
Pt here for sinus congestion for over one month.  Pt states she was on antibiotics but did not complete the course.

## 2017-12-02 NOTE — ED Provider Notes (Signed)
[facility]   [id number] 12/02/17 Arrival Time: 1036  ASSESSMENT & PLAN:  1. Acute non-recurrent maxillary sinusitis     Meds ordered this encounter  Medications  . levofloxacin (LEVAQUIN) 750 MG tablet    Sig: Take 1 tablet (750 mg total) by mouth daily.    Dispense:  10 tablet    Refill:  0   Discussed typical duration of symptoms. OTC symptom care as needed. Ensure adequate fluid intake and rest. May f/u with PCP or here as needed.  Reviewed expectations re: course of current medical issues. Questions answered. Outlined signs and symptoms indicating need for more acute intervention. Patient verbalized understanding. After Visit Summary given.   SUBJECTIVE: History from: patient.  Latasha Moore is a 57 y.o. female who presents with complaint of nasal congestion, post-nasal drainage, and sinus pain. Onset gradual, approximately 3-4 weeks ago. Respiratory symptoms: none Fever: no. Overall normal PO intake without n/v. OTC treatment: decongestant without relief. History of frequent sinus infections: occasional. Social History   Tobacco Use  Smoking Status Former Smoker  Smokeless Tobacco Never Used   Reports taking amoxicillin about 4 weeks ago. Helped but symptoms returned shortly after finishing.  ROS: As per HPI.   OBJECTIVE:  Vitals:   12/02/17 1154  BP: (!) 182/106  Pulse: 90  Temp: 98.3 F (36.8 C)  TempSrc: Oral  SpO2: 97%     General appearance: alert; appears fatigued HEENT: nasal congestion; clear runny nose; throat irritation secondary to post-nasal drainage; bilateral maxillary tenderness to palpation; turbinates boggy Neck: supple without LAD Lungs: unlabored respirations, symmetrical air entry; cough: absent; no respiratory distress Skin: warm and dry Psychological: alert and cooperative; normal mood and affect  Allergies  Allergen Reactions  . Cefdinir Diarrhea and Other (See Comments)    Other Reaction: N, V  .  Telithromycin Diarrhea and Other (See Comments)    Other Reaction: GI Upset-N, V  . Codeine Palpitations and Other (See Comments)    dizzy    Past Medical History:  Diagnosis Date  . Allergy   . Arthritis   . Depression   . DVT (deep venous thrombosis) (HCC)   . Hyperlipidemia   . Hypertension   . Pulmonary emboli (HCC)    Family History  Problem Relation Age of Onset  . Hypertension Mother   . Sleep apnea Brother   . Deep vein thrombosis Brother   . Diabetes Neg Hx   . Cancer Neg Hx    Social History   Socioeconomic History  . Marital status: Single    Spouse name: Not on file  . Number of children: Not on file  . Years of education: Not on file  . Highest education level: Not on file  Occupational History  . Not on file  Social Needs  . Financial resource strain: Not on file  . Food insecurity:    Worry: Not on file    Inability: Not on file  . Transportation needs:    Medical: Not on file    Non-medical: Not on file  Tobacco Use  . Smoking status: Former Games developer  . Smokeless tobacco: Never Used  Substance and Sexual Activity  . Alcohol use: No  . Drug use: No  . Sexual activity: Not on file  Lifestyle  . Physical activity:    Days per week: Not on file    Minutes per session: Not on file  . Stress: Not on file  Relationships  . Social connections:  Talks on phone: Not on file    Gets together: Not on file    Attends religious service: Not on file    Active member of club or organization: Not on file    Attends meetings of clubs or organizations: Not on file    Relationship status: Not on file  . Intimate partner violence:    Fear of current or ex partner: Not on file    Emotionally abused: Not on file    Physically abused: Not on file    Forced sexual activity: Not on file  Other Topics Concern  . Not on file  Social History Narrative  . Not on file            Mardella Layman, MD 12/02/17 [phone]

## 2017-12-13 ENCOUNTER — Encounter: Payer: Self-pay | Admitting: Adult Health

## 2017-12-13 ENCOUNTER — Ambulatory Visit: Payer: BLUE CROSS/BLUE SHIELD | Admitting: Adult Health

## 2017-12-13 VITALS — BP 140/94 | Temp 98.2°F | Wt >= 6400 oz

## 2017-12-13 DIAGNOSIS — L02211 Cutaneous abscess of abdominal wall: Secondary | ICD-10-CM | POA: Diagnosis not present

## 2017-12-13 NOTE — Progress Notes (Signed)
Subjective:    Patient ID: Latasha Moore, female    DOB: 1960/12/20, 57 y.o.   MRN: [id number]  HPI 57 year old female who  has a past medical history of Allergy, Arthritis, Depression, DVT (deep venous thrombosis) (HCC), Hyperlipidemia, Hypertension, and Pulmonary emboli (HCC).  She presents to the office today for an acute issue of RLQ pain. Her pain started two days ago. Pain is described as " intense and stabbing sharp". She also reports that the skin " was hot to touch". Reports 24 hours after the pain started to resolve. Today in the office no pain at all and warmness had resolved.   She is currently taking Levaquin for sinusitis. Has two days left of this medication   Review of Systems See HPI   Past Medical History:  Diagnosis Date  . Allergy   . Arthritis   . Depression   . DVT (deep venous thrombosis) (HCC)   . Hyperlipidemia   . Hypertension   . Pulmonary emboli [facility])     Social History   Socioeconomic History  . Marital status: Single    Spouse name: Not on file  . Number of children: Not on file  . Years of education: Not on file  . Highest education level: Not on file  Occupational History  . Not on file  Social Needs  . Financial resource strain: Not on file  . Food insecurity:    Worry: Not on file    Inability: Not on file  . Transportation needs:    Medical: Not on file    Non-medical: Not on file  Tobacco Use  . Smoking status: Former Games developer  . Smokeless tobacco: Never Used  Substance and Sexual Activity  . Alcohol use: No  . Drug use: No  . Sexual activity: Not on file  Lifestyle  . Physical activity:    Days per week: Not on file    Minutes per session: Not on file  . Stress: Not on file  Relationships  . Social connections:    Talks on phone: Not on file    Gets together: Not on file    Attends religious service: Not on file    Active member of club or organization: Not on file    Attends meetings of clubs or organizations:  Not on file    Relationship status: Not on file  . Intimate partner violence:    Fear of current or ex partner: Not on file    Emotionally abused: Not on file    Physically abused: Not on file    Forced sexual activity: Not on file  Other Topics Concern  . Not on file  Social History Narrative  . Not on file    Past Surgical History:  Procedure Laterality Date  . ABDOMINAL HYSTERECTOMY    . BACK SURGERY    . CHOLECYSTECTOMY    . TONSILLECTOMY      Family History  Problem Relation Age of Onset  . Hypertension Mother   . Sleep apnea Brother   . Deep vein thrombosis Brother   . Diabetes Neg Hx   . Cancer Neg Hx     Allergies  Allergen Reactions  . Cefdinir Diarrhea and Other (See Comments)    Other Reaction: N, V  . Telithromycin Diarrhea and Other (See Comments)    Other Reaction: GI Upset-N, V  . Codeine Palpitations and Other (See Comments)    dizzy    Current Outpatient Medications on File  Prior to Visit  Medication Sig Dispense Refill  . FLUoxetine (PROZAC) 20 MG capsule Take 1 capsule (20 mg total) by mouth daily. 90 capsule 1  . Fluticasone Propionate (XHANCE) 93 MCG/ACT EXHU Place 2 application into the nose 2 (two) times daily. 16 mL 3  . levofloxacin (LEVAQUIN) 750 MG tablet Take 1 tablet (750 mg total) by mouth daily. 10 tablet 0  . loratadine (CLARITIN) 10 MG tablet Take 10 mg by mouth daily.    . montelukast (SINGULAIR) 10 MG tablet Take 10 mg by mouth daily as needed (allergies).    . rivaroxaban (XARELTO) 20 MG TABS tablet Take 1 tablet (20 mg total) by mouth daily with supper. 90 tablet 2  . valsartan (DIOVAN) 160 MG tablet TAKE 1 TABLET BY MOUTH EVERY DAY 90 tablet 1  . albuterol (PROVENTIL HFA;VENTOLIN HFA) 108 (90 Base) MCG/ACT inhaler Inhale 1-2 puffs into the lungs every 6 (six) hours as needed for wheezing or shortness of breath. (Patient not taking: Reported on 12/13/2017) 18 g 2   No current facility-administered medications on file prior to  visit.     BP (!) 140/94 (BP Location: Left Wrist)   Temp 98.2 F (36.8 C) (Oral)   Wt (!) 441 lb (200 kg)   BMI 63.28 kg/m       Objective:   Physical Exam  Constitutional: She is oriented to person, place, and time. She appears well-developed and well-nourished. No distress.  Cardiovascular: Normal rate, regular rhythm, normal heart sounds and intact distal pulses. Exam reveals no gallop and no friction rub.  No murmur heard. Pulmonary/Chest: Effort normal and breath sounds normal. No stridor. No respiratory distress. She has no wheezes. She has no rales. She exhibits no tenderness.  Abdominal: Soft. Bowel sounds are normal. She exhibits no distension and no mass. There is no tenderness. There is no rebound and no guarding. No hernia.  Neurological: She is alert and oriented to person, place, and time.  Skin: Skin is warm and dry. She is not diaphoretic.  Small non fluctuant abscess noted on RLQ on panus. No redness or warmth noted.  Nursing note and vitals reviewed.     Assessment & Plan:  1. Cutaneous abscess of abdominal wall - Seems to be resolving with Levaquin.  - Finish abx  - Follow up as needed  Shirline Frees, NP

## 2018-01-06 ENCOUNTER — Other Ambulatory Visit: Payer: Self-pay | Admitting: Family Medicine

## 2018-01-06 DIAGNOSIS — F331 Major depressive disorder, recurrent, moderate: Secondary | ICD-10-CM

## 2018-01-06 DIAGNOSIS — F419 Anxiety disorder, unspecified: Secondary | ICD-10-CM

## 2018-01-07 DIAGNOSIS — J019 Acute sinusitis, unspecified: Secondary | ICD-10-CM | POA: Diagnosis not present

## 2018-02-07 ENCOUNTER — Other Ambulatory Visit: Payer: Self-pay | Admitting: Family Medicine

## 2018-02-07 DIAGNOSIS — F419 Anxiety disorder, unspecified: Secondary | ICD-10-CM

## 2018-02-07 DIAGNOSIS — F331 Major depressive disorder, recurrent, moderate: Secondary | ICD-10-CM

## 2018-02-26 DIAGNOSIS — H6692 Otitis media, unspecified, left ear: Secondary | ICD-10-CM | POA: Diagnosis not present

## 2018-03-14 ENCOUNTER — Other Ambulatory Visit: Payer: Self-pay

## 2018-03-21 ENCOUNTER — Other Ambulatory Visit: Payer: Self-pay | Admitting: Family Medicine

## 2018-03-21 DIAGNOSIS — F419 Anxiety disorder, unspecified: Secondary | ICD-10-CM

## 2018-03-21 DIAGNOSIS — F331 Major depressive disorder, recurrent, moderate: Secondary | ICD-10-CM

## 2018-04-03 DIAGNOSIS — J0110 Acute frontal sinusitis, unspecified: Secondary | ICD-10-CM | POA: Diagnosis not present

## 2018-04-03 DIAGNOSIS — J0100 Acute maxillary sinusitis, unspecified: Secondary | ICD-10-CM | POA: Diagnosis not present

## 2018-04-10 ENCOUNTER — Other Ambulatory Visit: Payer: Self-pay | Admitting: Otolaryngology

## 2018-04-10 ENCOUNTER — Ambulatory Visit
Admission: RE | Admit: 2018-04-10 | Discharge: 2018-04-10 | Disposition: A | Discharge status: Home / Self Care | Payer: BLUE CROSS/BLUE SHIELD | Source: Ambulatory Visit | Attending: Otolaryngology | Admitting: Otolaryngology

## 2018-04-10 DIAGNOSIS — J321 Chronic frontal sinusitis: Secondary | ICD-10-CM

## 2018-04-10 DIAGNOSIS — J3089 Other allergic rhinitis: Secondary | ICD-10-CM | POA: Diagnosis not present

## 2018-04-10 DIAGNOSIS — R51 Headache: Secondary | ICD-10-CM | POA: Diagnosis not present

## 2018-04-10 DIAGNOSIS — J320 Chronic maxillary sinusitis: Secondary | ICD-10-CM | POA: Diagnosis not present

## 2018-04-10 DIAGNOSIS — J301 Allergic rhinitis due to pollen: Secondary | ICD-10-CM | POA: Diagnosis not present

## 2018-04-13 ENCOUNTER — Other Ambulatory Visit: Payer: Self-pay | Admitting: Family Medicine

## 2018-04-13 DIAGNOSIS — F419 Anxiety disorder, unspecified: Secondary | ICD-10-CM

## 2018-04-13 DIAGNOSIS — F331 Major depressive disorder, recurrent, moderate: Secondary | ICD-10-CM

## 2018-04-15 ENCOUNTER — Ambulatory Visit: Payer: BLUE CROSS/BLUE SHIELD | Admitting: Family Medicine

## 2018-04-15 ENCOUNTER — Encounter: Payer: Self-pay | Admitting: Family Medicine

## 2018-04-15 VITALS — BP 150/90 | HR 102 | Temp 97.8°F | Resp 16 | Ht 70.0 in | Wt >= 6400 oz

## 2018-04-15 DIAGNOSIS — R252 Cramp and spasm: Secondary | ICD-10-CM

## 2018-04-15 DIAGNOSIS — L304 Erythema intertrigo: Secondary | ICD-10-CM | POA: Diagnosis not present

## 2018-04-15 DIAGNOSIS — Z6841 Body Mass Index (BMI) 40.0 and over, adult: Secondary | ICD-10-CM

## 2018-04-15 DIAGNOSIS — J3089 Other allergic rhinitis: Secondary | ICD-10-CM

## 2018-04-15 DIAGNOSIS — I10 Essential (primary) hypertension: Secondary | ICD-10-CM

## 2018-04-15 DIAGNOSIS — F331 Major depressive disorder, recurrent, moderate: Secondary | ICD-10-CM

## 2018-04-15 LAB — BASIC METABOLIC PANEL
BUN: 20 mg/dL (ref 6–23)
CHLORIDE: 98 meq/L (ref 96–112)
CO2: 24 meq/L (ref 19–32)
Calcium: 9.2 mg/dL (ref 8.4–10.5)
Creatinine, Ser: 0.83 mg/dL (ref 0.40–1.20)
GFR: 91.27 mL/min (ref >60.00)
Glucose, Bld: 101 mg/dL — ABNORMAL HIGH (ref 70–99)
Potassium: 4.3 mEq/L (ref 3.5–5.1)
Sodium: 136 mEq/L (ref 135–145)

## 2018-04-15 LAB — TSH: TSH: 2.27 u[IU]/mL (ref 0.35–4.50)

## 2018-04-15 LAB — CK: CK TOTAL: 73 U/L (ref 7–177)

## 2018-04-15 MED ORDER — TIZANIDINE HCL 4 MG PO TABS: 4.0000 mg | ORAL_TABLET | Freq: Every day | ORAL | 0 refills | Status: DC

## 2018-04-15 MED ORDER — VALSARTAN 320 MG PO TABS: 320.0000 mg | ORAL_TABLET | Freq: Every day | ORAL | 1 refills | Status: DC

## 2018-04-15 MED ORDER — AMLODIPINE BESYLATE 5 MG PO TABS: 5.0000 mg | ORAL_TABLET | Freq: Every day | ORAL | 1 refills | Status: DC

## 2018-04-15 MED ORDER — NYSTATIN 100000 UNIT/GM EX POWD: Freq: Three times a day (TID) | CUTANEOUS | 0 refills | Status: DC

## 2018-04-15 NOTE — Assessment & Plan Note (Signed)
Not well controlled. Possible complications of elevated BP discussed. Diovan increased from 160 mg to 320 mg. Amlodipine 5 mg added. Recommend monitoring BP at home. Adverse side effects of NSAIDs discussed. Annual eye examination. Follow-up in 4 weeks, before if needed.

## 2018-04-15 NOTE — Assessment & Plan Note (Signed)
We discussed possible etiologies as well as treatment options. She agrees with trying a muscle relaxant, Zanaflex at bedtime. BMP ordered today to check for electrolyte abnormality. Further recommendations will be given according to lab results.

## 2018-04-15 NOTE — Patient Instructions (Addendum)
A few things to remember from today's visit:   Hypertension, essential, benign - Plan: amLODipine (NORVASC) 5 MG tablet, valsartan (DIOVAN) 320 MG tablet, Basic metabolic panel, TSH  Depression, major, recurrent, moderate (HCC)  Cramps, muscle, general - Plan: TSH, CK, tiZANidine (ZANAFLEX) 4 MG tablet  Intertrigo - Plan: nystatin (MYCOSTATIN/NYSTOP) powder  Diovan increased to 320 mg daily. Amlodipine added today, 5 mg daily at bedtime. Try to monitor BP periodically at home. Low-salt diet is very important. Zanaflex 4 mg at bedtime may help with cramps. Nystatin powder in the lower abdomen is in between folds to help the redness.  Use an antibacterial soap and try to keep area dry.   Please be sure medication list is accurate. If a new problem present, please set up appointment sooner than planned today.

## 2018-04-15 NOTE — Progress Notes (Signed)
ACUTE VISIT   HPI:  Chief Complaint  Patient presents with  . Follow-up    discuss blood pressure issues and medication refill    [name] is a 57 y.o. female, who is here today complaining of elevated BP. She was last seen on 06/19/2017. BP has been up to 207/121last week.  She is on Valsartan 160 mg daily. She does not check BP at home. Not consistent with low salt diet.She eats chips and pop corn.  Denies visual changes, chest pain, dyspnea, palpitation, claudication, focal weakness, or worsening edema.  She has been dealing with nasal congestion,sinus pressure,rhinorrhea for a few months. She has completed a few abx courses and Prednisone tapers. She is following with ENT and according to pt,sinus Cx are pending. She is taking Mucinex ,using nasal irrigations with saline,Singulair 10 mg, Flonase nasal spray,and Loratadine. She has not had fever,chills,dysphagia,or odynophagia.   She is also c/o generalized intermittent severe muscle cramps: Abdomen and LE's. She has not identified exacerbating or alleviating factors. Episodes last a few minutes and happen a few times per day. She is taking Ibuprofen 800 mg daily prn.  She is also concerned about burning like sensation on lower abdomen. She was recently treated for abdominal wall abscess. She is not having pain as she did when she had the abscess but she is afraid to have one again.   She is also requesting refills on Prozac 20 mg,which she is taking for anxiety and depression. Medication is helping. She denies side effects. No suicidal thoughts. She follows with psychotherapist.  In the past she has tried Cymbalta and citalopram.   She is not exercising due to chronic pain and unstable gait. She has not been consistent with a healthy diet.  Wt Readings from Last 3 Encounters:  04/15/18 (!) 462 lb 8 oz (209.8 kg)  12/13/17 (!) 441 lb (200 kg)  06/19/17 (!) 457 lb 4 oz (207.4 kg)    Initially Prozac helped her to control appetite but due to stress she went back to non healthy dietary habits.   Review of Systems  Constitutional: Positive for fatigue. Negative for activity change, appetite change and fever.  HENT: Positive for congestion, postnasal drip, rhinorrhea, sinus pressure and sneezing. Negative for mouth sores, nosebleeds, sore throat and trouble swallowing.   Eyes: Negative for redness and visual disturbance.  Respiratory: Negative for cough, shortness of breath and wheezing.   Cardiovascular: Negative for chest pain, palpitations and leg swelling.  Gastrointestinal: Negative for abdominal pain, nausea and vomiting.       Negative for changes in bowel habits.  Genitourinary: Negative for decreased urine volume and hematuria.  Musculoskeletal: Positive for back pain, gait problem and myalgias.  Skin: Positive for rash. Negative for wound.  Allergic/Immunologic: Positive for environmental allergies.  Neurological: Negative for syncope, weakness and headaches.  Psychiatric/Behavioral: Negative for confusion. The patient is nervous/anxious.       Current Outpatient Medications on File Prior to Visit  Medication Sig Dispense Refill  . albuterol (PROVENTIL HFA;VENTOLIN HFA) 108 (90 Base) MCG/ACT inhaler Inhale 1-2 puffs into the lungs every 6 (six) hours as needed for wheezing or shortness of breath. 18 g 2  . fluticasone (FLONASE) 50 MCG/ACT nasal spray TAKE 2 SPRAY(S) (NASAL) 1 TIME PER DAY EACH NOSTRIL  5  . Fluticasone Propionate (XHANCE) 93 MCG/ACT EXHU Place 2 application into the nose 2 (two) times daily. 16 mL 3  . loratadine (CLARITIN) 10 MG tablet Take 10  mg by mouth daily.    . montelukast (SINGULAIR) 10 MG tablet Take 10 mg by mouth daily as needed (allergies).    . rivaroxaban (XARELTO) 20 MG TABS tablet Take 1 tablet (20 mg total) by mouth daily with supper. 90 tablet 2  . FLUoxetine (PROZAC) 20 MG capsule TAKE 1 CAPSULE BY MOUTH EVERY DAY 90  capsule 2   No current facility-administered medications on file prior to visit.      Past Medical History:  Diagnosis Date  . Allergy   . Arthritis   . Depression   . DVT (deep venous thrombosis) (HCC)   . Hyperlipidemia   . Hypertension   . Pulmonary emboli (HCC)    Allergies  Allergen Reactions  . Cefdinir Diarrhea and Other (See Comments)    Other Reaction: N, V  . Telithromycin Diarrhea and Other (See Comments)    Other Reaction: GI Upset-N, V  . Codeine Palpitations and Other (See Comments)    dizzy    Social History   Socioeconomic History  . Marital status: Single    Spouse name: Not on file  . Number of children: Not on file  . Years of education: Not on file  . Highest education level: Not on file  Occupational History  . Not on file  Social Needs  . Financial resource strain: Not on file  . Food insecurity:    Worry: Not on file    Inability: Not on file  . Transportation needs:    Medical: Not on file    Non-medical: Not on file  Tobacco Use  . Smoking status: Former Games developermoker  . Smokeless tobacco: Never Used  Substance and Sexual Activity  . Alcohol use: No  . Drug use: No  . Sexual activity: Not on file  Lifestyle  . Physical activity:    Days per week: Not on file    Minutes per session: Not on file  . Stress: Not on file  Relationships  . Social connections:    Talks on phone: Not on file    Gets together: Not on file    Attends religious service: Not on file    Active member of club or organization: Not on file    Attends meetings of clubs or organizations: Not on file    Relationship status: Not on file  Other Topics Concern  . Not on file  Social History Narrative  . Not on file    Vitals:   04/15/18 1430  BP: (!) 150/90  Pulse: (!) 102  Resp: 16  Temp: 97.8 F (36.6 C)  SpO2: 99%   Body mass index is 66.36 kg/m.    Physical Exam  Nursing note and vitals reviewed. Constitutional: She is oriented to person, place,  and time. She appears well-developed. No distress.  HENT:  Head: Normocephalic and atraumatic.  Mouth/Throat: Oropharynx is clear and moist and mucous membranes are normal.  Nasal voice,mouth breathing.  Eyes: Pupils are equal, round, and reactive to light. Conjunctivae are normal.  Cardiovascular: Regular rhythm. Tachycardia present.  No murmur heard. Pulses:      Posterior tibial pulses are 2+ on the right side, and 2+ on the left side.  Respiratory: Effort normal and breath sounds normal. No respiratory distress.  GI: Soft. She exhibits no mass. There is no hepatomegaly. There is no tenderness.  Musculoskeletal: She exhibits no edema.  She had a couple episode of muscle cramps,left flank and lower abdomen.  Lymphadenopathy:    She has  no cervical adenopathy.  Neurological: She is alert and oriented to person, place, and time. She has normal strength. No cranial nerve deficit.  Antalgic gait assisted by a cane.  Skin: Skin is warm. Rash noted. Rash is macular. There is erythema.  Lower abdomen in skin fold/crease she has erythema, no induration or masses. No tenderness. Also erythema in groin areas,bilateral.  Psychiatric: She has a normal mood and affect.  Well groomed, good eye contact.      ASSESSMENT AND PLAN:   Ms. Tymira was seen today for follow-up.  Orders Placed This Encounter  Procedures  . Basic metabolic panel  . TSH  . CK     Lab Results  Component Value Date   TSH 2.27 04/15/2018   Lab Results  Component Value Date   CREATININE 0.83 04/15/2018   BUN 20 04/15/2018   NA 136 04/15/2018   K 4.3 04/15/2018   CL 98 04/15/2018   CO2 24 04/15/2018   Lab Results  Component Value Date   CKTOTAL 73 04/15/2018     Hypertension, essential, benign Not well controlled. Possible complications of elevated BP discussed. Diovan increased from 160 mg to 320 mg. Amlodipine 5 mg added. Recommend monitoring BP at home. Adverse side effects of NSAIDs  discussed. Annual eye examination. Follow-up in 4 weeks, before if needed.   Allergic rhinitis with probable nonallergic component She just completed prednisone treatment and has had several antibiotic treatment with no improvement. Continue following with ENT.  Depression, major, recurrent, moderate (HCC) Improved. Continue Prozac 20 mg daily.  Follow-up in 4 months.  Cramps, muscle, general We discussed possible etiologies as well as treatment options. She agrees with trying a muscle relaxant, Zanaflex at bedtime. BMP ordered today to check for electrolyte abnormality. Further recommendations will be given according to lab results.  Intertrigo Prone to recurrent episodes 2 to obese abdomen. Antibacterial soap to use daily recommended. Try to keep area dry, nystatin powder 3 times daily. Monitor for signs of infection.   Morbid obesity with BMI of 60.0-69.9, adult (HCC)  Gaining wt steadily. We discussed benefits of wt loss as well as adverse effects of obesity. Consistency with healthy diet and physical activity as tolerated recommended.   .  Return in about 4 weeks (around 05/13/2018) for HTN.      Brailyn Killion G. [place], MD  [facility]. [place] office.

## 2018-04-15 NOTE — Assessment & Plan Note (Signed)
Prone to recurrent episodes 2 to obese abdomen. Antibacterial soap to use daily recommended. Try to keep area dry, nystatin powder 3 times daily. Monitor for signs of infection.

## 2018-04-15 NOTE — Assessment & Plan Note (Signed)
Improved. Continue Prozac 20 mg daily.  Follow-up in 4 months.

## 2018-04-15 NOTE — Assessment & Plan Note (Signed)
She just completed prednisone treatment and has had several antibiotic treatment with no improvement. Continue following with ENT.

## 2018-04-17 ENCOUNTER — Encounter: Payer: Self-pay | Admitting: Family Medicine

## 2018-04-17 DIAGNOSIS — J301 Allergic rhinitis due to pollen: Secondary | ICD-10-CM | POA: Diagnosis not present

## 2018-04-17 DIAGNOSIS — J305 Allergic rhinitis due to food: Secondary | ICD-10-CM | POA: Diagnosis not present

## 2018-04-17 DIAGNOSIS — J343 Hypertrophy of nasal turbinates: Secondary | ICD-10-CM | POA: Diagnosis not present

## 2018-04-17 DIAGNOSIS — J3081 Allergic rhinitis due to animal (cat) (dog) hair and dander: Secondary | ICD-10-CM | POA: Diagnosis not present

## 2018-04-21 ENCOUNTER — Encounter: Payer: Self-pay | Admitting: Allergy and Immunology

## 2018-04-21 ENCOUNTER — Other Ambulatory Visit: Payer: Self-pay | Admitting: Allergy

## 2018-04-21 ENCOUNTER — Ambulatory Visit: Payer: BLUE CROSS/BLUE SHIELD | Admitting: Allergy and Immunology

## 2018-04-21 VITALS — BP 130/80 | HR 94 | Temp 98.4°F | Ht 70.6 in | Wt >= 6400 oz

## 2018-04-21 DIAGNOSIS — J320 Chronic maxillary sinusitis: Secondary | ICD-10-CM

## 2018-04-21 DIAGNOSIS — J45909 Unspecified asthma, uncomplicated: Secondary | ICD-10-CM

## 2018-04-21 DIAGNOSIS — J3089 Other allergic rhinitis: Secondary | ICD-10-CM | POA: Diagnosis not present

## 2018-04-21 MED ORDER — XHANCE 93 MCG/ACT NA EXHU: 2.0000 | INHALANT_SUSPENSION | Freq: Two times a day (BID) | NASAL | 3 refills | Status: DC

## 2018-04-21 MED ORDER — AZELASTINE HCL 0.15 % NA SOLN: 2.0000 | Freq: Two times a day (BID) | NASAL | 5 refills | Status: DC

## 2018-04-21 MED ORDER — CARBINOXAMINE MALEATE 4 MG PO TABS: ORAL_TABLET | ORAL | 1 refills | Status: DC

## 2018-04-21 NOTE — Assessment & Plan Note (Signed)
   Continue appropriate allergen avoidance measures.  Treatment plan as outlined above for chronic sinusitis.  The risks and benefits of aeroallergen immunotherapy have been discussed. The patient is interested in the possibility of initiating immunotherapy if insurance coverage is favorable. She will let us know how she would like to proceed.

## 2018-04-21 NOTE — Assessment & Plan Note (Signed)
   Continue montelukast 10 mg daily bedtime and albuterol HFA, 1 to 2 inhalations every 4-6 hours if needed.  Subjective and objective measures of pulmonary function will be followed and the treatment plan will be adjusted accordingly.

## 2018-04-21 NOTE — Progress Notes (Signed)
Follow-up Note  RE: Latasha Moore MRN: [id number] DOB: 1961/07/27 Date of Office Visit: 04/21/2018  Primary care provider: [place], Betty G, MD Referring provider: [place], Betty G, MD  History of present illness: Latasha Moore is a 57 y.o. female with allergic rhinosinusitis and asthma presenting today for sick visit.  She reports that since May she has experienced persistent sinus pressure, nasal congestion, and postnasal drainage.  She has been on multiple rounds of antibiotics and steroids without adequate relief.  She saw Dr. Haroldine Laws approximately 2 weeks ago and was told that this problem was probably allergy related as imaging studies were negative.  She is currently using saline mist twice a day, loratadine 10 mg daily, and fluticasone nasal spray daily without adequate symptom relief.  She does not complain of fevers, chills, or discolored mucus production. Latasha Moore's asthma has been well controlled.  While taking montelukast 10 mg daily at bedtime she rarely requires albuterol rescue, does not experience limitations in normal daily activities due to asthma symptoms, and does not experience nocturnal awakenings due to lower respiratory symptoms.  Assessment and plan: Chronic sinusitis  Continue appropriate allergen avoidance measures.  Prior authorization will be sent in for [facility], 2 sprays per nostril twice daily.  The patient has failed fluticasone nasal spray.  A prescription has been provided for azelastine nasal spray, 1-2 sprays per nostril 2 times daily as needed.   Nasal saline lavage (NeilMed) has been recommended as needed and prior to medicated nasal sprays along with instructions for proper administration.  Discontinue loratadine (Claritin).  A prescription has been provided for carbinoxamine 4 mg every 6-8 hours if needed.  The risks and benefits of aeroallergen immunotherapy have been discussed. The patient is interested in the possibility of  initiating immunotherapy if insurance coverage is favorable. She will let us know how she would like to proceed.  Seasonal and perennial allergic rhinitis  Continue appropriate allergen avoidance measures.  Treatment plan as outlined above for chronic sinusitis.  The risks and benefits of aeroallergen immunotherapy have been discussed. The patient is interested in the possibility of initiating immunotherapy if insurance coverage is favorable. She will let us know how she would like to proceed.  Allergic bronchitis  Continue montelukast 10 mg daily bedtime and albuterol HFA, 1 to 2 inhalations every 4-6 hours if needed.  Subjective and objective measures of pulmonary function will be followed and the treatment plan will be adjusted accordingly.   Meds ordered this encounter  Medications  . XHANCE 93 MCG/ACT EXHU    Sig: Place 2 sprays into both nostrils 2 (two) times daily.    Dispense:  16 mL    Refill:  3  . Azelastine HCl 0.15 % SOLN    Sig: Place 2 sprays into both nostrils 2 (two) times daily.    Dispense:  30 mL    Refill:  5  . Carbinoxamine Maleate 4 MG TABS    Sig: 1 tablet every 6-8 hours if needed    Dispense:  30 each    Refill:  1    Diagnostics: Spirometry:  Normal with an FEV1 of 3.47 L (2.75 L predicted) and an FEV1 ratio of 109%. This study was performed while the patient was asymptomatic.  Please see scanned spirometry results for details.      Physical examination: Blood pressure 130/80, pulse 94, temperature 98.4 F (36.9 C), temperature source Oral, height 5' 10.6" (1.793 m), weight (!) 462 lb 12.8 oz (209.9 kg), SpO2  98 %.  General: Alert, interactive, in no acute distress. HEENT: TMs pearly gray, turbinates edematous with thick discharge, post-pharynx moderately erythematous. Neck: Supple without lymphadenopathy. Lungs: Clear to auscultation without wheezing, rhonchi or rales. CV: Normal S1, S2 without murmurs. Skin: Warm and dry, without lesions or  rashes.  The following portions of the patient's history were reviewed and updated as appropriate: allergies, current medications, past family history, past medical history, past social history, past surgical history and problem list.  Allergies as of 04/21/2018      Reactions   Cefdinir Diarrhea, Other (See Comments)   Other Reaction: N, V   Telithromycin Diarrhea, Other (See Comments)   Other Reaction: GI Upset-N, V   Codeine Palpitations, Other (See Comments)   dizzy      Medication List        Accurate as of 04/21/18  1:36 PM. Always use your most recent med list.          amLODipine 5 MG tablet Commonly known as:  NORVASC Take 1 tablet (5 mg total) by mouth daily.   Azelastine HCl 0.15 % Soln Place 2 sprays into both nostrils 2 (two) times daily.   Carbinoxamine Maleate 4 MG Tabs 1 tablet every 6-8 hours if needed   FLUoxetine 20 MG capsule Commonly known as:  PROZAC TAKE 1 CAPSULE BY MOUTH EVERY DAY   fluticasone 50 MCG/ACT nasal spray Commonly known as:  FLONASE TAKE 2 SPRAY(S) (NASAL) 1 TIME PER DAY EACH NOSTRIL   XHANCE 93 MCG/ACT Exhu Generic drug:  Fluticasone Propionate Place 2 sprays into both nostrils 2 (two) times daily.   loratadine 10 MG tablet Commonly known as:  CLARITIN Take 10 mg by mouth daily.   montelukast 10 MG tablet Commonly known as:  SINGULAIR Take 10 mg by mouth daily as needed (allergies).   NOREL AD 4-10-325 MG Tabs Generic drug:  Chlorphen-PE-Acetaminophen TAKE 1 TABLET BY MOUTH 4 TIMES A DAY FOR 5 DAYS   nystatin powder Commonly known as:  MYCOSTATIN/NYSTOP Apply topically 3 (three) times daily.   rivaroxaban 20 MG Tabs tablet Commonly known as:  XARELTO Take 1 tablet (20 mg total) by mouth daily with supper.   tiZANidine 4 MG tablet Commonly known as:  ZANAFLEX Take 1 tablet (4 mg total) by mouth at bedtime.   valsartan 320 MG tablet Commonly known as:  DIOVAN Take 1 tablet (320 mg total) by mouth daily.        Allergies  Allergen Reactions  . Cefdinir Diarrhea and Other (See Comments)    Other Reaction: N, V  . Telithromycin Diarrhea and Other (See Comments)    Other Reaction: GI Upset-N, V  . Codeine Palpitations and Other (See Comments)    dizzy   Review of systems: Review of systems negative except as noted in HPI / PMHx or noted below: Constitutional: Negative.  HENT: Negative.   Eyes: Negative.  Respiratory: Negative.   Cardiovascular: Negative.  Gastrointestinal: Negative.  Genitourinary: Negative.  Musculoskeletal: Negative.  Neurological: Negative.  Endo/Heme/Allergies: Negative.  Cutaneous: Negative.  Past Medical History:  Diagnosis Date  . Allergy   . Arthritis   . Depression   . DVT (deep venous thrombosis) (HCC)   . Hyperlipidemia   . Hypertension   . Pulmonary emboli (HCC)     Family History  Problem Relation Age of Onset  . Hypertension Mother   . Sleep apnea Brother   . Deep vein thrombosis Brother   . Diabetes Neg Hx   .  Cancer Neg Hx     Social History   Socioeconomic History  . Marital status: Single    Spouse name: Not on file  . Number of children: Not on file  . Years of education: Not on file  . Highest education level: Not on file  Occupational History  . Not on file  Social Needs  . Financial resource strain: Not on file  . Food insecurity:    Worry: Not on file    Inability: Not on file  . Transportation needs:    Medical: Not on file    Non-medical: Not on file  Tobacco Use  . Smoking status: Former Games developermoker  . Smokeless tobacco: Never Used  Substance and Sexual Activity  . Alcohol use: No  . Drug use: No  . Sexual activity: Not on file  Lifestyle  . Physical activity:    Days per week: Not on file    Minutes per session: Not on file  . Stress: Not on file  Relationships  . Social connections:    Talks on phone: Not on file    Gets together: Not on file    Attends religious service: Not on file    Active member of  club or organization: Not on file    Attends meetings of clubs or organizations: Not on file    Relationship status: Not on file  . Intimate partner violence:    Fear of current or ex partner: Not on file    Emotionally abused: Not on file    Physically abused: Not on file    Forced sexual activity: Not on file  Other Topics Concern  . Not on file  Social History Narrative  . Not on file    I appreciate the opportunity to take part in Asyria's care. Please do not hesitate to contact me with questions.  Sincerely,   R. Jorene Guestarter Yuridia Couts, MD

## 2018-04-21 NOTE — Assessment & Plan Note (Signed)
   Continue appropriate allergen avoidance measures.  [facility] per nostril twice daily.  The patient has failed fluticasone nasal spray.  A prescription has been provided for azelastine nasal spray, 1-2 sprays per nostril 2 times daily as needed.   Nasal saline lavage (NeilMed) has been recommended as needed and prior to medicated nasal sprays along with instructions for proper administration.  Discontinue loratadine (Claritin).  A prescription has been provided for carbinoxamine 4 mg every 6-8 hours if needed.  The risks and benefits of aeroallergen immunotherapy have been discussed. The patient is interested in the possibility of initiating immunotherapy if insurance coverage is favorable. She will let us know how she would like to proceed.

## 2018-04-21 NOTE — Patient Instructions (Addendum)
Chronic sinusitis  Continue appropriate allergen avoidance measures.  Prior authorization will be sent in for [facility], 2 sprays per nostril twice daily.  The patient has failed fluticasone nasal spray.  A prescription has been provided for azelastine nasal spray, 1-2 sprays per nostril 2 times daily as needed.   Nasal saline lavage (NeilMed) has been recommended as needed and prior to medicated nasal sprays along with instructions for proper administration.  Discontinue loratadine (Claritin).  A prescription has been provided for carbinoxamine 4 mg every 6-8 hours if needed.  The risks and benefits of aeroallergen immunotherapy have been discussed. The patient is interested in the possibility of initiating immunotherapy if insurance coverage is favorable. She will let us know how she would like to proceed.  Seasonal and perennial allergic rhinitis  Continue appropriate allergen avoidance measures.  Treatment plan as outlined above for chronic sinusitis.  The risks and benefits of aeroallergen immunotherapy have been discussed. The patient is interested in the possibility of initiating immunotherapy if insurance coverage is favorable. She will let us know how she would like to proceed.  Allergic bronchitis  Continue montelukast 10 mg daily bedtime and albuterol HFA, 1 to 2 inhalations every 4-6 hours if needed.  Subjective and objective measures of pulmonary function will be followed and the treatment plan will be adjusted accordingly.   Return in about 5 months (around 09/21/2018), or if symptoms worsen or fail to improve.  Control of House Dust Mite Allergen  House dust mites play a major role in allergic asthma and rhinitis.  They occur in environments with high humidity wherever human skin, the food for dust mites is found. High levels have been detected in dust obtained from mattresses, pillows, carpets, upholstered furniture, bed covers, clothes and soft toys.  The principal  allergen of the house dust mite is found in its feces.  A gram of dust may contain 1,000 mites and 250,000 fecal particles.  Mite antigen is easily measured in the air during house cleaning activities.    1. Encase mattresses, including the box spring, and pillow, in an air tight cover.  Seal the zipper end of the encased mattresses with wide adhesive tape. 2. Wash the bedding in water of 130 degrees Farenheit weekly.  Avoid cotton comforters/quilts and flannel bedding: the most ideal bed covering is the dacron comforter. 3. Remove all upholstered furniture from the bedroom. 4. Remove carpets, carpet padding, rugs, and non-washable window drapes from the bedroom.  Wash drapes weekly or use plastic window coverings. 5. Remove all non-washable stuffed toys from the bedroom.  Wash stuffed toys weekly. 6. Have the room cleaned frequently with a vacuum cleaner and a damp dust-mop.  The patient should not be in a room which is being cleaned and should wait 1 hour after cleaning before going into the room. 7. Close and seal all heating outlets in the bedroom.  Otherwise, the room will become filled with dust-laden air.  An electric heater can be used to heat the room. 8. Reduce indoor humidity to less than 50%.  Do not use a humidifier.  Reducing Pollen Exposure  The American Academy of Allergy, Asthma and Immunology suggests the following steps to reduce your exposure to pollen during allergy seasons.    1. Do not hang sheets or clothing out to dry; pollen may collect on these items. 2. Do not mow lawns or spend time around freshly cut grass; mowing stirs up pollen. 3. Keep windows closed at night.  Keep car windows  closed while driving. 4. Minimize morning activities outdoors, a time when pollen counts are usually at their highest. 5. Stay indoors as much as possible when pollen counts or humidity is high and on windy days when pollen tends to remain in the air longer. 6. Use air conditioning when  possible.  Many air conditioners have filters that trap the pollen spores. 7. Use a HEPA room air filter to remove pollen form the indoor air you breathe.

## 2018-04-23 ENCOUNTER — Telehealth: Payer: Self-pay | Admitting: *Deleted

## 2018-04-23 NOTE — Telephone Encounter (Signed)
Received fax from pharmacy [facility] [address] would like rx for Noral AD. Writer called patient to get info on why she needed this rx. No answer could not leave a message

## 2018-04-25 NOTE — Telephone Encounter (Signed)
Called patient and phone rang and never went to voicemail, was unable to leave message.

## 2018-05-07 ENCOUNTER — Other Ambulatory Visit: Payer: Self-pay | Admitting: Family Medicine

## 2018-05-07 DIAGNOSIS — I10 Essential (primary) hypertension: Secondary | ICD-10-CM

## 2018-05-08 ENCOUNTER — Other Ambulatory Visit: Payer: Self-pay | Admitting: Family Medicine

## 2018-05-08 DIAGNOSIS — R252 Cramp and spasm: Secondary | ICD-10-CM

## 2018-05-13 ENCOUNTER — Encounter: Payer: Self-pay | Admitting: Family Medicine

## 2018-05-13 ENCOUNTER — Ambulatory Visit: Payer: BLUE CROSS/BLUE SHIELD | Admitting: Family Medicine

## 2018-05-13 VITALS — BP 126/84 | HR 108 | Temp 97.7°F | Resp 16 | Ht 70.6 in | Wt >= 6400 oz

## 2018-05-13 DIAGNOSIS — Z6841 Body Mass Index (BMI) 40.0 and over, adult: Secondary | ICD-10-CM

## 2018-05-13 DIAGNOSIS — J45909 Unspecified asthma, uncomplicated: Secondary | ICD-10-CM | POA: Diagnosis not present

## 2018-05-13 DIAGNOSIS — I10 Essential (primary) hypertension: Secondary | ICD-10-CM

## 2018-05-13 DIAGNOSIS — J3089 Other allergic rhinitis: Secondary | ICD-10-CM

## 2018-05-13 MED ORDER — DEXAMETHASONE 4 MG PO TABS: 4.0000 mg | ORAL_TABLET | Freq: Two times a day (BID) | ORAL | 0 refills | Status: AC

## 2018-05-13 MED ORDER — ALBUTEROL SULFATE HFA 108 (90 BASE) MCG/ACT IN AERS: 2.0000 | INHALATION_SPRAY | Freq: Four times a day (QID) | RESPIRATORY_TRACT | 2 refills | Status: DC | PRN

## 2018-05-13 MED ORDER — VALSARTAN 320 MG PO TABS: 320.0000 mg | ORAL_TABLET | Freq: Every day | ORAL | 1 refills | Status: DC

## 2018-05-13 NOTE — Assessment & Plan Note (Signed)
Gained about 9 pounds since her last visit in 03/2018. We discussed benefits of wt loss as well as adverse effects of obesity. Consistency with healthy diet and physical activity recommended.

## 2018-05-13 NOTE — Assessment & Plan Note (Signed)
We discussed diagnosis, prognosis, and treatment options. Discouraged from taking frequent antibiotics for "sinusitis", side effect discussed. Continue current management. Saline nasal irrigations several times per day. Prescription for dexamethasone 4 mg to take twice daily given, recommend starting with sinus pressure gets worse. She will continue following with immunologist.

## 2018-05-13 NOTE — Assessment & Plan Note (Signed)
BP better controlled. No changes in Diovan 320 mg daily Low-salt diet recommended. Continue monitoring BP at home. Follow-up in 5 to 6 months.

## 2018-05-13 NOTE — Progress Notes (Signed)
[name] is a 57 y.o.female, who is here today to follow on HTN.  Currently on Diovan 320 mg daily.  Last visit I also recommended amlodipine 5 mg.  She did not start amlodipine because BP seems to be well controlled after Diovan was increased from 160 mg to 320 mg. She has checked BP at few times, 120s/70s.  She is taking medications as instructed, no side effects reported.  She has not noted unusual headache, visual changes, exertional chest pain, dyspnea,  focal weakness, or worsening edema.   Lab Results  Component Value Date   CREATININE 0.83 04/15/2018   BUN 20 04/15/2018   NA 136 04/15/2018   K 4.3 04/15/2018   CL 98 04/15/2018   CO2 24 04/15/2018    -Today she is also complaining about intermittent nasal congestion and sinus pressure. She took 2 doses of amoxicillin she had left from prior prescription. She has followed with ENT, according to patient, she had sinus imaging done and it was negative for sinusitis.  She was referred to immunologist and according to patient, treatment with a biologic agent was recommended but she was not able to started treatment due to cost. She has not had fever, chills, or unusual fatigue. No sick contact or recent travel.  She is also requesting a refill on albuterol inhaler. She has history of asthma, dyspnea and wheezing exacerbated by exertion and exposure to dust and heat. She states that her immunologist recommended continuing albuterol inhaler daily as needed, her albuterol has expired. She is also on Flonase nasal spray, Singulair 10 mg, and Norel AD daily as needed.   Review of Systems  Constitutional: Positive for fatigue. Negative for activity change, appetite change, fever and unexpected weight change.  HENT: Positive for congestion, postnasal drip, rhinorrhea and sinus pressure. Negative for mouth sores, nosebleeds, sore throat and trouble swallowing.   Eyes: Negative for redness and visual disturbance.   Respiratory: Negative for cough, shortness of breath and wheezing.   Cardiovascular: Negative for chest pain and palpitations.  Gastrointestinal: Negative for abdominal pain, nausea and vomiting.       Negative for changes in bowel habits.  Genitourinary: Negative for decreased urine volume and hematuria.  Allergic/Immunologic: Positive for environmental allergies.  Neurological: Negative for syncope, weakness and headaches.  Psychiatric/Behavioral: Negative for confusion. The patient is nervous/anxious.      Current Outpatient Medications on File Prior to Visit  Medication Sig Dispense Refill  . Azelastine HCl 0.15 % SOLN Place 2 sprays into both nostrils 2 (two) times daily. 30 mL 5  . Carbinoxamine Maleate 4 MG TABS 1 tablet every 6-8 hours if needed 30 each 1  . FLUoxetine (PROZAC) 20 MG capsule TAKE 1 CAPSULE BY MOUTH EVERY DAY 90 capsule 2  . fluticasone (FLONASE) 50 MCG/ACT nasal spray TAKE 2 SPRAY(S) (NASAL) 1 TIME PER DAY EACH NOSTRIL  5  . montelukast (SINGULAIR) 10 MG tablet Take 10 mg by mouth daily as needed (allergies).    . NOREL AD 4-10-325 MG TABS TAKE 1 TABLET BY MOUTH 4 TIMES A DAY FOR 5 DAYS  1  . nystatin (MYCOSTATIN/NYSTOP) powder Apply topically 3 (three) times daily. 60 g 0  . rivaroxaban (XARELTO) 20 MG TABS tablet Take 1 tablet (20 mg total) by mouth daily with supper. 90 tablet 2  . tiZANidine (ZANAFLEX) 4 MG tablet TAKE 1 TABLET BY MOUTH AT BEDTIME 30 tablet 0  . XHANCE 93 MCG/ACT EXHU Place 2 sprays into both  nostrils 2 (two) times daily. 32 mL 3   No current facility-administered medications on file prior to visit.      Past Medical History:  Diagnosis Date  . Allergy   . Arthritis   . Depression   . DVT (deep venous thrombosis) (HCC)   . Hyperlipidemia   . Hypertension   . Pulmonary emboli (HCC)     Allergies  Allergen Reactions  . Cefdinir Diarrhea and Other (See Comments)    Other Reaction: N, V  . Telithromycin Diarrhea and Other (See  Comments)    Other Reaction: GI Upset-N, V  . Codeine Palpitations and Other (See Comments)    dizzy    Social History   Socioeconomic History  . Marital status: Single    Spouse name: Not on file  . Number of children: Not on file  . Years of education: Not on file  . Highest education level: Not on file  Occupational History  . Not on file  Social Needs  . Financial resource strain: Not on file  . Food insecurity:    Worry: Not on file    Inability: Not on file  . Transportation needs:    Medical: Not on file    Non-medical: Not on file  Tobacco Use  . Smoking status: Former Games developer  . Smokeless tobacco: Never Used  Substance and Sexual Activity  . Alcohol use: No  . Drug use: No  . Sexual activity: Not on file  Lifestyle  . Physical activity:    Days per week: Not on file    Minutes per session: Not on file  . Stress: Not on file  Relationships  . Social connections:    Talks on phone: Not on file    Gets together: Not on file    Attends religious service: Not on file    Active member of club or organization: Not on file    Attends meetings of clubs or organizations: Not on file    Relationship status: Not on file  Other Topics Concern  . Not on file  Social History Narrative  . Not on file    Vitals:   05/13/18 1418  BP: 126/84  Pulse: (!) 108  Resp: 16  Temp: 97.7 F (36.5 C)  SpO2: 97%   Body mass index is 66.49 kg/m.  Wt Readings from Last 3 Encounters:  05/13/18 (!) 471 lb 6 oz (213.8 kg)  04/21/18 (!) 462 lb 12.8 oz (209.9 kg)  04/15/18 (!) 462 lb 8 oz (209.8 kg)     Physical Exam  Nursing note and vitals reviewed. Constitutional: She is oriented to person, place, and time. She appears well-developed. No distress.  HENT:  Head: Normocephalic and atraumatic.  Mouth/Throat: Oropharynx is clear and moist and mucous membranes are normal.  Nasal voice. Mouth breathing.  Eyes: Pupils are equal, round, and reactive to light. Conjunctivae  are normal.  Cardiovascular: Regular rhythm. Tachycardia present.  No murmur heard. Pulses:      Dorsalis pedis pulses are 2+ on the right side, and 2+ on the left side.  Respiratory: Effort normal and breath sounds normal. No respiratory distress.  GI: Soft. She exhibits no mass. There is no tenderness.  Musculoskeletal: She exhibits edema (1+ pitting LE edema, bilateral.).  Lymphadenopathy:    She has no cervical adenopathy.  Neurological: She is alert and oriented to person, place, and time. She has normal strength. No cranial nerve deficit.  Antalgic gait assisted with a cane.  Skin:  Skin is warm. No rash noted. No erythema.  Psychiatric: Her mood appears anxious.  Well groomed, good eye contact.    ASSESSMENT AND PLAN:   [name] is here today for HTN follow up.  Diagnoses and all orders for this visit:  Reactive airway disease with wheezing without complication, unspecified asthma severity, unspecified whether persistent  I do not appreciate wheezing or rales on auscultation. Dexamethasone Rx given because she is leaving town and to take in case she has symptoms.Side effects of medication discussed. Albuterol inh prn. Continue following with immunologist.  -     dexamethasone (DECADRON) 4 MG tablet; Take 1 tablet (4 mg total) by mouth 2 (two) times daily with a meal for 5 days. -     albuterol (PROVENTIL HFA;VENTOLIN HFA) 108 (90 Base) MCG/ACT inhaler; Inhale 2 puffs into the lungs every 6 (six) hours as needed for wheezing or shortness of breath.  Morbid obesity with BMI of 60.0-69.9, adult (HCC) Gained about 9 pounds since her last visit in 03/2018. We discussed benefits of wt loss as well as adverse effects of obesity. Consistency with healthy diet and physical activity recommended.    Seasonal and perennial allergic rhinitis We discussed diagnosis, prognosis, and treatment options. Discouraged from taking frequent antibiotics for "sinusitis", side  effect discussed. Continue current management. Saline nasal irrigations several times per day. Prescription for dexamethasone 4 mg to take twice daily given, recommend starting with sinus pressure gets worse. She will continue following with immunologist.  Hypertension, essential, benign BP better controlled. No changes in Diovan 320 mg daily Low-salt diet recommended. Continue monitoring BP at home. Follow-up in 5 to 6 months.     Dimple Bastyr G. [place], MD  [facility]. [place] office.

## 2018-05-13 NOTE — Patient Instructions (Signed)
A few things to remember from today's visit:   Hypertension, essential, benign - Plan: valsartan (DIOVAN) 320 MG tablet  Seasonal and perennial allergic rhinitis - Plan: dexamethasone (DECADRON) 4 MG tablet  Reactive airway disease with wheezing without complication, unspecified asthma severity, unspecified whether persistent - Plan: dexamethasone (DECADRON) 4 MG tablet, albuterol (PROVENTIL HFA;VENTOLIN HFA) 108 (90 Base) MCG/ACT inhaler  There are 2 forms of allergic rhinitis: . Seasonal (hay fever): Caused by an allergy to pollen and/or mold spores in the air. Pollen is the fine powder that comes from the stamen of flowering plants. It can be carried through the air and is easily inhaled. Symptoms are seasonal and usually occur in spring, late summer, and fall. Marland Kitchen Perennial: Caused by other allergens such as dust mites, pet hair or dander, or mold. Symptoms occur year-round.  Symptoms: Your symptoms can vary, depending on the severity of your allergies. Symptoms can include: Sneezing, coughing.itching (mostly eyes, nose, mouth, throat and skin),runny nose,stuffy nose.headache,pressure in the nose and cheeks,ear fullness and popping, sore throat.watery, red, or swollen eyes,dark circles under your eyes,trouble smelling, and sometimes hives.  Allergic rhinitis cannot be prevented. You can help your symptoms by avoiding the things that you are allergic, including: . Keeping windows closed. This is especially important during high-pollen seasons. . Washing your hands after petting animals. . Using dust- and mite-proof bedding and mattress covers. . Wearing glasses outside to protect your eyes. . Showering before bed to wash off allergens from hair and skin. You can also avoid things that can make your symptoms worse, such as: . aerosol sprays . air pollution . cold temperatures . humidity . irritating fumes . tobacco smoke . wind . wood smoke.   Antihistamines help reduce the sneezing,  runny nose, and itchiness of allergies. These come in pill form and as nasal sprays. Allegra,Zyrtec,or Claritin are some examples. Decongestants, such as pseudoephedrine and phenylephrine, help temporarily relieve the stuffy nose of allergies. Decongestants are found in many medicines and come as pills, nose sprays, and nose drops. They could increase heart rate and cause tachycardia and tremor. Nasal Afrin should not be used for more than 3 days because you can become dependent on them. This causes you to feel even more stopped-up when you try to quit using them.  Nasal sprays: steroids or antihistaminics. Over the counter intranasal sterids: Nasocort,Rhinocort,or Flonase.You won't notice their benefits for up to 2 weeks after starting them. Allergy shots or sublingual tablets when other treatment do not help.This is done by immunologists.   Please be sure medication list is accurate. If a new problem present, please set up appointment sooner than planned today.

## 2018-05-17 ENCOUNTER — Encounter: Payer: Self-pay | Admitting: Family Medicine

## 2018-06-03 ENCOUNTER — Other Ambulatory Visit: Payer: Self-pay | Admitting: Family Medicine

## 2018-06-03 DIAGNOSIS — R252 Cramp and spasm: Secondary | ICD-10-CM

## 2018-06-30 ENCOUNTER — Other Ambulatory Visit: Payer: Self-pay | Admitting: Family Medicine

## 2018-06-30 DIAGNOSIS — I2699 Other pulmonary embolism without acute cor pulmonale: Secondary | ICD-10-CM

## 2018-07-07 ENCOUNTER — Encounter: Payer: Self-pay | Admitting: Family Medicine

## 2018-07-07 ENCOUNTER — Ambulatory Visit: Payer: BLUE CROSS/BLUE SHIELD | Admitting: Family Medicine

## 2018-07-07 VITALS — BP 160/100 | HR 98 | Temp 98.4°F | Resp 12 | Ht 70.0 in | Wt >= 6400 oz

## 2018-07-07 DIAGNOSIS — I10 Essential (primary) hypertension: Secondary | ICD-10-CM

## 2018-07-07 DIAGNOSIS — R5383 Other fatigue: Secondary | ICD-10-CM | POA: Diagnosis not present

## 2018-07-07 DIAGNOSIS — F419 Anxiety disorder, unspecified: Secondary | ICD-10-CM

## 2018-07-07 LAB — CBC
HCT: 43.8 % (ref 36.0–46.0)
Hemoglobin: 14.4 g/dL (ref 12.0–15.0)
MCHC: 32.8 g/dL (ref 30.0–36.0)
MCV: 92.8 fl (ref 78.0–100.0)
Platelets: 293 10*3/uL (ref 150.0–400.0)
RBC: 4.72 Mil/uL (ref 3.87–5.11)
RDW: 14.4 % (ref 11.5–15.5)
WBC: 7.8 10*3/uL (ref 4.0–10.5)

## 2018-07-07 LAB — BASIC METABOLIC PANEL
BUN: 14 mg/dL (ref 6–23)
CHLORIDE: 104 meq/L (ref 96–112)
CO2: 23 meq/L (ref 19–32)
Calcium: 9.1 mg/dL (ref 8.4–10.5)
Creatinine, Ser: 0.85 mg/dL (ref 0.40–1.20)
GFR: 88.72 mL/min (ref >60.00)
GLUCOSE: 138 mg/dL — AB (ref 70–99)
Potassium: 3.7 mEq/L (ref 3.5–5.1)
Sodium: 138 mEq/L (ref 135–145)

## 2018-07-07 LAB — TSH: TSH: 2.1 u[IU]/mL (ref 0.35–4.50)

## 2018-07-07 NOTE — Assessment & Plan Note (Signed)
Poorly controlled. We discussed possible complications of hypertension. Recommend increasing dose of valsartan from 160 mg to 320 mg. Continue monitoring BP at home. Instructed about warning signs. Follow-up in 6 weeks.

## 2018-07-07 NOTE — Patient Instructions (Addendum)
A few things to remember from today's visit:   Hypertension, essential, benign - Plan: Basic metabolic panel  Other fatigue - Plan: CBC, TSH  Anxiety disorder, unspecified type  It is a common symptom associated with multiple factors: psychologic,medications, systemic illness, sleep disorders,infections, and unknown causes. Some work-up can be done to evaluate for common causes as thyroid disease,anemia, or abnormalities in calcium,potassium,or sodium.  Increase losartan from 160 to 320, monitor blood pressure at home periodically.  Please be sure medication list is accurate. If a new problem present, please set up appointment sooner than planned today.

## 2018-07-07 NOTE — Progress Notes (Signed)
ACUTE VISIT   HPI:  Chief Complaint  Patient presents with  . Sleeping Problem    Sleeps all the time    Latasha Moore is a 57 y.o. female, who is here today complaining of severe fatigue since 04/2018. She attributes symptoms to frequent treatment with prednisone.  Recently she was in a cruise for a couple weeks, states that she was sleeping most of the time, she got up to go to the bathroom and eat. No sick contact or insect bite. Negative for abdominal pain, nausea, vomiting, changes in bowel habits, or urinary symptoms.  She has some chills and subjective fever a few days before her cruise.  States that in the past she has similar symptoms, even worse,and related to long prednisone treatment.  She states that she was diagnosed with "adrenal fatigue", she recovered after a couple weeks of rest.  She denies falling asleep while driving. She is taking frequent naps.   A few years ago she was screened for OSA and according to patient, it was negative.  She feels like problem is a slowly getting better.  Hypertension: She is also reporting an episode of "low BP" a couple days ago, she felt like she was going to faint/dizzy.She did not check BP but assumed it was related to low blood pressure.So she held valsartan 320 mg for 2 days and resumed it at lower dose because BP was elevated.  She is now on valsartan 160 mg daily. BP readings for the past 3 days most 140s-150s/70s. She denies unusual headache, chest pain, dyspnea, focal weakness, or syncope.  She feels like fatigue is exacerbated anxiety, she is getting behind with assignments at work. She is currently on Prozac 20 mg daily. She denies depressed mood or suicidal thoughts.  States that she is not allowed to come back until she gets a note from her PCP that is okay to do so, she would like to go back tomorrow.  Review of Systems  Constitutional: Positive for activity change and fatigue. Negative  for appetite change.  HENT: Positive for congestion (stable) and rhinorrhea. Negative for mouth sores, nosebleeds, sore throat and trouble swallowing.   Eyes: Negative for redness and visual disturbance.  Respiratory: Negative for cough, shortness of breath and wheezing.   Cardiovascular: Negative for chest pain, palpitations and leg swelling.  Gastrointestinal: Negative for abdominal pain, nausea and vomiting.       Negative for changes in bowel habits.  Endocrine: Negative for cold intolerance and heat intolerance.  Genitourinary: Negative for decreased urine volume, dysuria and hematuria.  Musculoskeletal: Positive for arthralgias and gait problem.       Hx of OA.  Skin: Negative for pallor and rash.  Allergic/Immunologic: Positive for environmental allergies.  Neurological: Negative for syncope, weakness and headaches.  Hematological: Negative for adenopathy. Does not bruise/bleed easily.  Psychiatric/Behavioral: Negative for confusion. The patient is nervous/anxious.      Current Outpatient Medications on File Prior to Visit  Medication Sig Dispense Refill  . albuterol (PROVENTIL HFA;VENTOLIN HFA) 108 (90 Base) MCG/ACT inhaler Inhale 2 puffs into the lungs every 6 (six) hours as needed for wheezing or shortness of breath. 1 Inhaler 2  . Azelastine HCl 0.15 % SOLN Place 2 sprays into both nostrils 2 (two) times daily. 30 mL 5  . Carbinoxamine Maleate 4 MG TABS 1 tablet every 6-8 hours if needed 30 each 1  . FLUoxetine (PROZAC) 20 MG capsule TAKE 1 CAPSULE BY MOUTH EVERY  DAY 90 capsule 2  . fluticasone (FLONASE) 50 MCG/ACT nasal spray TAKE 2 SPRAY(S) (NASAL) 1 TIME PER DAY EACH NOSTRIL  5  . montelukast (SINGULAIR) 10 MG tablet Take 10 mg by mouth daily as needed (allergies).    . NOREL AD 4-10-325 MG TABS TAKE 1 TABLET BY MOUTH 4 TIMES A DAY FOR 5 DAYS  1  . nystatin (MYCOSTATIN/NYSTOP) powder Apply topically 3 (three) times daily. 60 g 0  . tiZANidine (ZANAFLEX) 4 MG tablet TAKE 1  TABLET BY MOUTH AT BEDTIME 30 tablet 0  . valsartan (DIOVAN) 320 MG tablet Take 1 tablet (320 mg total) by mouth daily. 90 tablet 1  . XARELTO 20 MG TABS tablet (START AFTER COMPLETING XARELTO 15 MG) TAKE 1 TABLET (20 MG TOTAL) BY MOUTH DAILY WITH SUPPER. 90 tablet 2  . XHANCE 93 MCG/ACT EXHU Place 2 sprays into both nostrils 2 (two) times daily. 32 mL 3   No current facility-administered medications on file prior to visit.      Past Medical History:  Diagnosis Date  . Allergy   . Arthritis   . Depression   . DVT (deep venous thrombosis) (HCC)   . Hyperlipidemia   . Hypertension   . Pulmonary emboli (HCC)    Allergies  Allergen Reactions  . Cefdinir Diarrhea and Other (See Comments)    Other Reaction: N, V  . Telithromycin Diarrhea and Other (See Comments)    Other Reaction: GI Upset-N, V  . Codeine Palpitations and Other (See Comments)    dizzy    Social History   Socioeconomic History  . Marital status: Single    Spouse name: Not on file  . Number of children: Not on file  . Years of education: Not on file  . Highest education level: Not on file  Occupational History  . Not on file  Social Needs  . Financial resource strain: Not on file  . Food insecurity:    Worry: Not on file    Inability: Not on file  . Transportation needs:    Medical: Not on file    Non-medical: Not on file  Tobacco Use  . Smoking status: Former Games developermoker  . Smokeless tobacco: Never Used  Substance and Sexual Activity  . Alcohol use: No  . Drug use: No  . Sexual activity: Not on file  Lifestyle  . Physical activity:    Days per week: Not on file    Minutes per session: Not on file  . Stress: Not on file  Relationships  . Social connections:    Talks on phone: Not on file    Gets together: Not on file    Attends religious service: Not on file    Active member of club or organization: Not on file    Attends meetings of clubs or organizations: Not on file    Relationship status: Not  on file  Other Topics Concern  . Not on file  Social History Narrative  . Not on file    Vitals:   07/07/18 1050  BP: (!) 160/100  Pulse: 98  Resp: 12  Temp: 98.4 F (36.9 C)  SpO2: 97%   Body mass index is 66.97 kg/m.   Physical Exam  Nursing note and vitals reviewed. Constitutional: She is oriented to person, place, and time. She appears well-developed. No distress.  HENT:  Head: Normocephalic and atraumatic.  Mouth/Throat: Oropharynx is clear and moist and mucous membranes are normal.  Nasal voice.  Eyes: Pupils  are equal, round, and reactive to light. Conjunctivae are normal.  Cardiovascular: Normal rate and regular rhythm.  No murmur heard. Pulses:      Dorsalis pedis pulses are 2+ on the right side, and 2+ on the left side.  Respiratory: Effort normal and breath sounds normal. No respiratory distress.  GI: Soft. She exhibits no mass. There is no tenderness.  Musculoskeletal: She exhibits edema (Trace pitting LE edema, bilateral.).  Lymphadenopathy:    She has no cervical adenopathy.  Neurological: She is alert and oriented to person, place, and time. She has normal strength. No cranial nerve deficit.  Gait assisted with a cane.  Skin: Skin is warm. No rash noted. No erythema.  Psychiatric: Her mood appears anxious.  Well groomed, good eye contact.     ASSESSMENT AND PLAN:  Ms. Layanna was seen today for sleeping problem.  Orders Placed This Encounter  Procedures  . CBC  . Basic metabolic panel  . TSH    Lab Results  Component Value Date   WBC 7.8 07/07/2018   HGB 14.4 07/07/2018   HCT 43.8 07/07/2018   MCV 92.8 07/07/2018   PLT 293.0 07/07/2018   Lab Results  Component Value Date   CREATININE 0.85 07/07/2018   BUN 14 07/07/2018   NA 138 07/07/2018   K 3.7 07/07/2018   CL 104 07/07/2018   CO2 23 07/07/2018   Lab Results  Component Value Date   TSH 2.10 07/07/2018    Other fatigue We discussed possible etiologies: Systemic illness,  immunologic,endocrinology,sleep disorder, psychiatric/psychologic, infectious,medications side effects, and idiopathic. Examination today does not suggest a serious process.  She would like to go back to work tomorrow, so letter given.  Further recommendations will be given according to lab results. If problem is persistent and lab work is otherwise negative, with need to consider repeating sleep study.  -     CBC -     TSH  Hypertension, essential, benign Poorly controlled. We discussed possible complications of hypertension. Recommend increasing dose of valsartan from 160 mg to 320 mg. Continue monitoring BP at home. Instructed about warning signs. Follow-up in 6 weeks.  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 For now I recommend continuing with Prozac 20 mg daily. Educated about warning signs. I will see her back in 6 weeks, before if needed.    Return in about 6 weeks (around 08/18/2018) for HTN,anxiety,and fatigue.Marland Kitchen     Betty G. [place], MD  [facility]. [place] office.

## 2018-07-07 NOTE — Assessment & Plan Note (Addendum)
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 For now I recommend continuing with Prozac 20 mg daily. Educated about warning signs. I will see her back in 6 weeks, before if needed.

## 2018-07-10 ENCOUNTER — Encounter: Payer: Self-pay | Admitting: Family Medicine

## 2018-07-11 ENCOUNTER — Other Ambulatory Visit: Payer: Self-pay | Admitting: Family Medicine

## 2018-07-11 DIAGNOSIS — R252 Cramp and spasm: Secondary | ICD-10-CM

## 2018-08-08 ENCOUNTER — Other Ambulatory Visit: Payer: Self-pay | Admitting: Family Medicine

## 2018-08-08 DIAGNOSIS — R252 Cramp and spasm: Secondary | ICD-10-CM

## 2018-09-01 ENCOUNTER — Other Ambulatory Visit: Payer: Self-pay

## 2018-09-01 ENCOUNTER — Telehealth: Payer: Self-pay | Admitting: Allergy and Immunology

## 2018-09-01 MED ORDER — MONTELUKAST SODIUM 10 MG PO TABS: 10.0000 mg | ORAL_TABLET | Freq: Every day | ORAL | 5 refills | Status: DC | PRN

## 2018-09-01 NOTE — Telephone Encounter (Signed)
Pt called and needs to have Singulair called into [facility] on [address]. [phone].

## 2018-09-01 NOTE — Telephone Encounter (Signed)
rx sent. Left vm advising pt.

## 2018-10-13 DIAGNOSIS — M25561 Pain in right knee: Secondary | ICD-10-CM | POA: Diagnosis not present

## 2018-10-13 DIAGNOSIS — M1711 Unilateral primary osteoarthritis, right knee: Secondary | ICD-10-CM | POA: Diagnosis not present

## 2018-10-13 DIAGNOSIS — M1712 Unilateral primary osteoarthritis, left knee: Secondary | ICD-10-CM | POA: Diagnosis not present

## 2018-10-13 DIAGNOSIS — M25562 Pain in left knee: Secondary | ICD-10-CM | POA: Diagnosis not present

## 2018-11-04 ENCOUNTER — Telehealth: Payer: Self-pay | Admitting: *Deleted

## 2018-11-04 NOTE — Telephone Encounter (Signed)
Attempted to contact patient. Per Dr. [place] She needs f/u appt from last OV. No answer. LVM for patient to return call

## 2018-11-12 ENCOUNTER — Other Ambulatory Visit: Payer: Self-pay | Admitting: Family Medicine

## 2018-11-12 ENCOUNTER — Encounter: Payer: Self-pay | Admitting: Family Medicine

## 2018-11-12 ENCOUNTER — Ambulatory Visit (INDEPENDENT_AMBULATORY_CARE_PROVIDER_SITE_OTHER): Payer: Self-pay | Admitting: Family Medicine

## 2018-11-12 ENCOUNTER — Other Ambulatory Visit: Payer: Self-pay

## 2018-11-12 VITALS — BP 139/89 | HR 81 | Resp 16

## 2018-11-12 DIAGNOSIS — F331 Major depressive disorder, recurrent, moderate: Secondary | ICD-10-CM

## 2018-11-12 DIAGNOSIS — I10 Essential (primary) hypertension: Secondary | ICD-10-CM

## 2018-11-12 DIAGNOSIS — J3089 Other allergic rhinitis: Secondary | ICD-10-CM

## 2018-11-12 DIAGNOSIS — Z86711 Personal history of pulmonary embolism: Secondary | ICD-10-CM

## 2018-11-12 DIAGNOSIS — F419 Anxiety disorder, unspecified: Secondary | ICD-10-CM

## 2018-11-12 DIAGNOSIS — Z6841 Body Mass Index (BMI) 40.0 and over, adult: Secondary | ICD-10-CM

## 2018-11-12 MED ORDER — AMLODIPINE BESYLATE 5 MG PO TABS: 5.0000 mg | ORAL_TABLET | Freq: Every day | ORAL | 0 refills | Status: DC

## 2018-11-12 NOTE — Assessment & Plan Note (Addendum)
Because decreased exposure to pollen symptoms are better. No changes in current management. Recommend doing nasal irrigation with saline several times per day. In future years FMLA to be able to work from home during Spring could be considered. Continue following with immunologist.

## 2018-11-12 NOTE — Assessment & Plan Note (Signed)
Well-controlled on on Prozac 20 mg. No changes in current management. Following a healthful diet and engaging in regular physical activity may also help. Instructed about warning signs.

## 2018-11-12 NOTE — Assessment & Plan Note (Signed)
Today she checked BP, 139/89 and elevated at 164/90. Usually BP is not high at 140/90.  Amlodipine 5 mg, she still has medication at home, old prescription, she did not take medication. We discussed some side effects of amlodipine. Continue Diovan 320 mg daily. Continue monitoring BP regularly and low-salt diet. We discussed some possible complications of poorly controlled BP. Follow-up in 4 months.

## 2018-11-12 NOTE — Assessment & Plan Note (Signed)
We discussed adverse effects of obesity and benefits of weight loss. Encouraged to continue a healthful diet. Due to knee OA, it is difficult for her to exercise regularly but she could engage in low impact exercise as tolerated.

## 2018-11-12 NOTE — Assessment & Plan Note (Signed)
It has improved since her last visit. No changes in Prozac 20 mg daily.

## 2018-11-12 NOTE — Progress Notes (Signed)
Virtual Visit via Video Note   I connected with Latasha Moore on 11/12/18 at 12:15 PM EDT by a video enabled telemedicine application and verified that I am speaking with the correct person using two identifiers.  Location patient: home Location provider:home office Persons participating in the virtual visit: patient, provider  I discussed the limitations of evaluation and management by telemedicine and the availability of in person appointments. She expressed understanding and agreed to proceed.  HPI: Latasha Moore is a 58 yo female with Hx of OA,seasonal allergies,HTN,anxiety,depression,and s/p PE on chronic anticoagulation among some.  She was last seen on 07/07/2018.  Hypertension:  Currently on currently she is on Diovan 320 mg daily. She is checking BP periodically, high 130s/80s.  She is taking medications as instructed, no side effects reported. She has not noted unusual headache, visual changes, exertional chest pain, dyspnea,  focal weakness, or worsening edema.   Lab Results  Component Value Date   CREATININE 0.85 07/07/2018   BUN 14 07/07/2018   NA 138 07/07/2018   K 3.7 07/07/2018   CL 104 07/07/2018   CO2 23 07/07/2018   -Nasal congestion, rhinorrhea, and postnasal drainage have been better controlled since she is working from home. She has not had sinus pressure or symptoms that suggest acute sinusitis. She follows with immunologist. Currently she is on Flonase nasal spray, she just started Claritin, taking Singulair 10 mg daily, and doing nasal irrigation with saline twice per day. She denies sore throat, cough, or wheezing.  -In 07/2018 she started eating healthier, she has noted about 25 pounds weight loss. She is not exercising regularly, it aggravates knee pain. She is following with orthopedist, last visit in 09/2018.   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 Currently she is on Xarelto 20 mg daily. She has not noted signs of bleeding.   ROS:  See pertinent positives and negatives per HPI.  Past Medical History:  Diagnosis Date  . Allergy   . Arthritis   . Depression   . DVT (deep venous thrombosis) (HCC)   . Hyperlipidemia   . Hypertension   . Pulmonary emboli [facility])     Past Surgical History:  Procedure Laterality Date  . ABDOMINAL HYSTERECTOMY    . BACK SURGERY    . CHOLECYSTECTOMY    . TONSILLECTOMY      Family History  Problem Relation Age of Onset  . Hypertension Mother   . Sleep apnea Brother   . Deep vein thrombosis Brother   . Diabetes Neg Hx   . Cancer Neg Hx     Social History   Socioeconomic History  . Marital status: Single    Spouse name: Not on file  . Number of children: Not on file  . Years of education: Not on file  . Highest education level: Not on file  Occupational History  . Not on file  Social Needs  . Financial resource strain: Not on file  . Food insecurity:    Worry: Not on file    Inability: Not on file  . Transportation needs:    Medical: Not on file    Non-medical: Not on file  Tobacco Use  . Smoking status: Former Games developer  . Smokeless tobacco: Never Used  Substance and Sexual Activity  . Alcohol use: No  . Drug use: No  . Sexual activity: Not on file  Lifestyle  . Physical activity:    Days per week: Not on file    Minutes  per session: Not on file  . Stress: Not on file  Relationships  . Social connections:    Talks on phone: Not on file    Gets together: Not on file    Attends religious service: Not on file    Active member of club or organization: Not on file    Attends meetings of clubs or organizations: Not on file    Relationship status: Not on file  . Intimate partner violence:    Fear of current or ex partner: Not on file    Emotionally abused: Not on file    Physically abused: Not on file    Forced sexual activity: Not on file  Other Topics Concern  . Not on file  Social History Narrative  . Not on file      Current Outpatient Medications:   .  albuterol (PROVENTIL HFA;VENTOLIN HFA) 108 (90 Base) MCG/ACT inhaler, Inhale 2 puffs into the lungs every 6 (six) hours as needed for wheezing or shortness of breath., Disp: 1 Inhaler, Rfl: 2 .  amLODipine (NORVASC) 5 MG tablet, Take 1 tablet (5 mg total) by mouth daily., Disp: 90 tablet, Rfl: 0 .  Azelastine HCl 0.15 % SOLN, Place 2 sprays into both nostrils 2 (two) times daily., Disp: 30 mL, Rfl: 5 .  Carbinoxamine Maleate 4 MG TABS, 1 tablet every 6-8 hours if needed, Disp: 30 each, Rfl: 1 .  FLUoxetine (PROZAC) 20 MG capsule, TAKE 1 CAPSULE BY MOUTH EVERY DAY, Disp: 90 capsule, Rfl: 2 .  fluticasone (FLONASE) 50 MCG/ACT nasal spray, TAKE 2 SPRAY(S) (NASAL) 1 TIME PER DAY EACH NOSTRIL, Disp: , Rfl: 5 .  montelukast (SINGULAIR) 10 MG tablet, Take 1 tablet (10 mg total) by mouth daily as needed (allergies)., Disp: 30 tablet, Rfl: 5 .  NOREL AD 4-10-325 MG TABS, TAKE 1 TABLET BY MOUTH 4 TIMES A DAY FOR 5 DAYS, Disp: , Rfl: 1 .  nystatin (MYCOSTATIN/NYSTOP) powder, Apply topically 3 (three) times daily., Disp: 60 g, Rfl: 0 .  tiZANidine (ZANAFLEX) 4 MG tablet, TAKE 1 TABLET BY MOUTH AT BEDTIME, Disp: 30 tablet, Rfl: 0 .  valsartan (DIOVAN) 320 MG tablet, Take 1 tablet (320 mg total) by mouth daily., Disp: 90 tablet, Rfl: 1 .  XARELTO 20 MG TABS tablet, (START AFTER COMPLETING XARELTO 15 MG) TAKE 1 TABLET (20 MG TOTAL) BY MOUTH DAILY WITH SUPPER., Disp: 90 tablet, Rfl: 2 .  XHANCE 93 MCG/ACT EXHU, Place 2 sprays into both nostrils 2 (two) times daily., Disp: 32 mL, Rfl: 3  EXAM:  VITALS per patient if applicable:BP 139/89   Pulse 81   Resp 16   GENERAL: alert, oriented, appears well and in no acute distress  HEENT: atraumatic, conjunttiva clear, no obvious abnormalities on inspection of external nose and ears  NECK: Otherwise normal movements of the head and neck  LUNGS: on inspection no signs of respiratory distress, breathing rate appears normal, no obvious gross SOB, gasping or  wheezing  CV: no obvious cyanosis  Latasha: moves all visible extremities without noticeable abnormality  PSYCH/NEURO: pleasant and cooperative, no obvious depression or anxiety, speech and thought processing grossly intact  ASSESSMENT AND PLAN:  Discussed the following assessment and plan:  Anxiety disorder It has improved since her last visit. No changes in Prozac 20 mg daily.   Seasonal and perennial allergic rhinitis Because decreased exposure to pollen symptoms are better. No changes in current management. Recommend doing nasal irrigation with saline several times per day. In future  years FMLA to be able to work from home during Spring could be considered. Continue following with immunologist.  Hypertension, essential, benign Today she checked BP, 139/89 and elevated at 164/90. Usually BP is not high at 140/90.  Amlodipine 5 mg, she still has medication at home, old prescription, she did not take medication. We discussed some side effects of amlodipine. Continue Diovan 320 mg daily. Continue monitoring BP regularly and low-salt diet. We discussed some possible complications of poorly controlled BP. Follow-up in 4 months.   Morbid obesity with BMI of 60.0-69.9, adult (HCC) We discussed adverse effects of obesity and benefits of weight loss. Encouraged to continue a healthful diet. Due to knee OA, it is difficult for her to exercise regularly but she could engage in low impact exercise as tolerated.  Depression, major, recurrent, moderate (HCC) Well-controlled on on Prozac 20 mg. No changes in current management. Following a healthful diet and engaging in regular physical activity may also help. Instructed about warning signs.  Hx of pulmonary embolus Tolerating medication well, no changes in Xarelto 20 mg daily. Side effects of medication discussed. Instructed about warning signs.    Due to current COVID-19 pandemia and to decrease risk of contact,we opted for hold  on lab work until next OV.  I discussed the assessment and treatment plan with the patient. She was provided an opportunity to ask questions and all were answered. The patient agreed with the plan and demonstrated an understanding of the instructions.    Return in about 4 months (around 03/14/2019) for HTN,anxiety,depression.     [place], MD

## 2018-11-12 NOTE — Assessment & Plan Note (Signed)
Tolerating medication well, no changes in Xarelto 20 mg daily. Side effects of medication discussed. Instructed about warning signs.

## 2018-11-12 NOTE — Telephone Encounter (Signed)
Received rx  refill for Diovan. Called patient to schedule a f/u appointment. No answer. LVM for patient to return call.

## 2018-12-29 DIAGNOSIS — M25761 Osteophyte, right knee: Secondary | ICD-10-CM | POA: Diagnosis not present

## 2018-12-29 DIAGNOSIS — M222X2 Patellofemoral disorders, left knee: Secondary | ICD-10-CM | POA: Diagnosis not present

## 2018-12-29 DIAGNOSIS — M1712 Unilateral primary osteoarthritis, left knee: Secondary | ICD-10-CM | POA: Diagnosis not present

## 2018-12-29 DIAGNOSIS — M25511 Pain in right shoulder: Secondary | ICD-10-CM | POA: Diagnosis not present

## 2018-12-29 DIAGNOSIS — M25512 Pain in left shoulder: Secondary | ICD-10-CM | POA: Diagnosis not present

## 2018-12-29 DIAGNOSIS — M25762 Osteophyte, left knee: Secondary | ICD-10-CM | POA: Diagnosis not present

## 2018-12-29 DIAGNOSIS — M1711 Unilateral primary osteoarthritis, right knee: Secondary | ICD-10-CM | POA: Diagnosis not present

## 2018-12-29 DIAGNOSIS — M222X1 Patellofemoral disorders, right knee: Secondary | ICD-10-CM | POA: Diagnosis not present

## 2019-01-06 ENCOUNTER — Other Ambulatory Visit: Payer: Self-pay | Admitting: Family Medicine

## 2019-01-06 DIAGNOSIS — M25762 Osteophyte, left knee: Secondary | ICD-10-CM | POA: Diagnosis not present

## 2019-01-06 DIAGNOSIS — M222X2 Patellofemoral disorders, left knee: Secondary | ICD-10-CM | POA: Diagnosis not present

## 2019-01-06 DIAGNOSIS — F419 Anxiety disorder, unspecified: Secondary | ICD-10-CM

## 2019-01-06 DIAGNOSIS — F331 Major depressive disorder, recurrent, moderate: Secondary | ICD-10-CM

## 2019-01-06 DIAGNOSIS — R2689 Other abnormalities of gait and mobility: Secondary | ICD-10-CM | POA: Diagnosis not present

## 2019-01-06 DIAGNOSIS — M25562 Pain in left knee: Secondary | ICD-10-CM | POA: Diagnosis not present

## 2019-01-06 DIAGNOSIS — M1712 Unilateral primary osteoarthritis, left knee: Secondary | ICD-10-CM | POA: Diagnosis not present

## 2019-01-07 DIAGNOSIS — M25561 Pain in right knee: Secondary | ICD-10-CM | POA: Diagnosis not present

## 2019-01-07 DIAGNOSIS — R2689 Other abnormalities of gait and mobility: Secondary | ICD-10-CM | POA: Diagnosis not present

## 2019-01-07 DIAGNOSIS — M1711 Unilateral primary osteoarthritis, right knee: Secondary | ICD-10-CM | POA: Diagnosis not present

## 2019-01-07 DIAGNOSIS — M222X1 Patellofemoral disorders, right knee: Secondary | ICD-10-CM | POA: Diagnosis not present

## 2019-02-06 ENCOUNTER — Other Ambulatory Visit: Payer: Self-pay | Admitting: Allergy and Immunology

## 2019-02-27 ENCOUNTER — Other Ambulatory Visit: Payer: Self-pay | Admitting: Allergy and Immunology

## 2019-02-27 NOTE — Telephone Encounter (Signed)
Courtesy refill of montelukast already given 02/06/19. Pt needs ov.

## 2019-04-08 ENCOUNTER — Other Ambulatory Visit: Payer: Self-pay | Admitting: Family Medicine

## 2019-04-08 DIAGNOSIS — I2699 Other pulmonary embolism without acute cor pulmonale: Secondary | ICD-10-CM

## 2019-04-29 ENCOUNTER — Other Ambulatory Visit: Payer: Self-pay | Admitting: Family Medicine

## 2019-04-29 DIAGNOSIS — I10 Essential (primary) hypertension: Secondary | ICD-10-CM

## 2019-05-01 ENCOUNTER — Other Ambulatory Visit: Payer: Self-pay | Admitting: Allergy and Immunology

## 2019-06-09 ENCOUNTER — Other Ambulatory Visit: Payer: Self-pay | Admitting: Allergy and Immunology

## 2019-06-10 ENCOUNTER — Other Ambulatory Visit: Payer: Self-pay

## 2019-07-23 DIAGNOSIS — Z20828 Contact with and (suspected) exposure to other viral communicable diseases: Secondary | ICD-10-CM | POA: Diagnosis not present

## 2019-08-23 ENCOUNTER — Ambulatory Visit (INDEPENDENT_AMBULATORY_CARE_PROVIDER_SITE_OTHER)
Admission: RE | Admit: 2019-08-23 | Discharge: 2019-08-23 | Disposition: A | Discharge status: Home / Self Care | Payer: BC Managed Care – PPO | Source: Ambulatory Visit

## 2019-08-23 DIAGNOSIS — B9789 Other viral agents as the cause of diseases classified elsewhere: Secondary | ICD-10-CM | POA: Diagnosis not present

## 2019-08-23 DIAGNOSIS — J0140 Acute pansinusitis, unspecified: Secondary | ICD-10-CM

## 2019-08-23 MED ORDER — PREDNISONE 10 MG PO TABS: 20.0000 mg | ORAL_TABLET | Freq: Every day | ORAL | 0 refills | Status: DC

## 2019-08-23 MED ORDER — MONTELUKAST SODIUM 10 MG PO TABS: 10.0000 mg | ORAL_TABLET | Freq: Every day | ORAL | 0 refills | Status: AC | PRN

## 2019-08-23 MED ORDER — NOREL AD 4-10-325 MG PO TABS: ORAL_TABLET | ORAL | 0 refills | Status: DC

## 2019-08-23 MED ORDER — AZELASTINE HCL 0.15 % NA SOLN: 2.0000 | Freq: Two times a day (BID) | NASAL | 0 refills | Status: DC

## 2019-08-23 MED ORDER — FLUTICASONE PROPIONATE 50 MCG/ACT NA SUSP: 2.0000 | Freq: Every day | NASAL | 0 refills | Status: DC

## 2019-08-23 MED ORDER — DOXYCYCLINE HYCLATE 100 MG PO CAPS: 100.0000 mg | ORAL_CAPSULE | Freq: Two times a day (BID) | ORAL | 0 refills | Status: DC

## 2019-08-23 NOTE — ED Provider Notes (Signed)
Virtual Visit via Video Note:  Teosha Casso  initiated request for Telemedicine visit with [facility] Urgent [facility] team. I connected with Candee Furbish  on 08/23/2019 at 10:58 AM  for a synchronized telemedicine visit using a video enabled HIPPA compliant telemedicine application. I verified that I am speaking with Candee Furbish  using two identifiers. Journei Thomassen Doree Fudge, PA-C  was physically located in a [facility] Urgent care site and Cinda Hara was located at a different location.   The limitations of evaluation and management by telemedicine as well as the availability of in-person appointments were discussed. Patient was informed that she  may incur a bill ( including co-pay) for this virtual visit encounter. Finnlee Guarnieri  expressed understanding and gave verbal consent to proceed with virtual visit.     History of Present Illness:Latasha Moore  is a 59 y.o. female presents with 2-3 week history of nasal congestion, sinus pressure, rhinorrhea, post nasal drip. Has had some cough as well. Denies fever, chills, body aches. Had diarrhea, nausea at the beginning of symptoms that has completely resolved. Denies shortness of breath, loss of taste/smell.    Patient has not had a COVID test since symptom started. She is currently staying at her mother's house, who recently passed away from COVID. States she is allergic to cats, and was exposed to cats at [employer]. Due to recent events, she has been quarantined for the past month.     Past Medical History:  Diagnosis Date  . Allergy   . Arthritis   . Depression   . DVT (deep venous thrombosis) (HCC)   . Hyperlipidemia   . Hypertension   . Pulmonary emboli (HCC)     Allergies  Allergen Reactions  . Cefdinir Diarrhea and Other (See Comments)    Other Reaction: N, V  . Telithromycin Diarrhea and Other (See Comments)    Other Reaction: GI Upset-N, V  . Codeine Palpitations and Other (See  Comments)    dizzy        Observations/Objective: General: Well appearing, nontoxic, no acute distress. Sitting comfortably. Head: Normocephalic, atraumatic Eye: No conjunctival injection, eyelid swelling. EOMI ENT: Mucus membranes moist, no lip cracking. Patient sniffing on exam due to drainage. +sinus pressure. Nasally voice due to nasal congestion. Pulm: Speaking in full sentences without difficulty. Normal effort. No respiratory distress, accessory muscle use. Frequent coughs that is nonproductive. Neuro: Normal mental status. Alert and oriented x 3.   Assessment and Plan: Start prednisone, doxycycline as directed. Refilled flonase, azelastine, singulair. Other symptomatic treatment discussed. Push fluids. Return precautions given. Patient expresses understanding and agrees to plan.  Follow Up Instructions:    I discussed the assessment and treatment plan with the patient. The patient was provided an opportunity to ask questions and all were answered. The patient agreed with the plan and demonstrated an understanding of the instructions.   The patient was advised to call back or seek an in-person evaluation if the symptoms worsen or if the condition fails to improve as anticipated.  I provided 20 minutes of non-face-to-face time during this encounter.    Belinda Fisher, PA-C  08/23/2019 10:58 AM         Belinda Fisher, PA-C 08/23/19 [id number]

## 2019-08-23 NOTE — Discharge Instructions (Addendum)
Start doxycycline and prednisone as directed. I have refilled your singulair, flonase, azelastine. Norel as needed for symptomatic management. If you develop worsening cough, shortness of breath, fever, will need in person evaluation.  [facility] at [facility]) [address] [place], [place], [place] [address] [phone]  [facility] at [facility] [address] [place], [place] [address] [phone]  [facility] at [facility] [address] [place], [place] [address] [phone]  [facility] at [facility] [address], [place], [place] [address] [phone]  [facility] at [facility] [address] [place]onnie Dawes [place], [place] [address] [phone]  [facility] at [facility] [address], [place], [place] [address] [phone]

## 2019-10-08 ENCOUNTER — Telehealth: Payer: Self-pay | Admitting: Family Medicine

## 2019-10-08 DIAGNOSIS — F331 Major depressive disorder, recurrent, moderate: Secondary | ICD-10-CM

## 2019-10-08 DIAGNOSIS — F419 Anxiety disorder, unspecified: Secondary | ICD-10-CM

## 2019-10-08 NOTE — Telephone Encounter (Signed)
Pt is requesting a refill for Fluoxetine(Prozac) 20 mg capsule. Pt is not living in [place] at the moment and is asking, if she needs an appointment can it be virtual? Pt uses [facility] in [place], [place] at [address] Thanks

## 2019-10-09 MED ORDER — FLUOXETINE HCL 20 MG PO CAPS: ORAL_CAPSULE | ORAL | 0 refills | Status: DC

## 2019-10-09 NOTE — Telephone Encounter (Signed)
Rx sent in per pt request.

## 2019-12-24 ENCOUNTER — Ambulatory Visit (INDEPENDENT_AMBULATORY_CARE_PROVIDER_SITE_OTHER)
Admission: RE | Admit: 2019-12-24 | Discharge: 2019-12-24 | Disposition: A | Discharge status: Home / Self Care | Payer: BC Managed Care – PPO | Source: Ambulatory Visit

## 2019-12-24 DIAGNOSIS — J0100 Acute maxillary sinusitis, unspecified: Secondary | ICD-10-CM | POA: Diagnosis not present

## 2019-12-24 MED ORDER — NOREL AD 4-10-325 MG PO TABS: ORAL_TABLET | ORAL | 0 refills | Status: DC

## 2019-12-24 MED ORDER — PREDNISONE 10 MG PO TABS: 40.0000 mg | ORAL_TABLET | Freq: Every day | ORAL | 0 refills | Status: AC

## 2019-12-24 NOTE — ED Provider Notes (Signed)
Virtual Visit via Video Note:  Latasha Moore  initiated request for Telemedicine visit with [facility] Urgent Care team. I connected with Latasha Moore  on 12/24/2019 at 9:17 AM  for a synchronized telemedicine visit using a video enabled HIPPA compliant telemedicine application. I verified that I am speaking with Latasha Moore  using two identifiers. Mickie Bail, NP  was physically located in a [facility] Urgent care site and Marshella Tello was located at a different location.   The limitations of evaluation and management by telemedicine as well as the availability of in-person appointments were discussed. Patient was informed that she  may incur a bill ( including co-pay) for this virtual visit encounter. Latasha Moore  expressed understanding and gave verbal consent to proceed with virtual visit.     History of Present Illness:Latasha Moore  is a 59 y.o. female presents for evaluation of nasal congestion, sinus pressure, left ear pressure, nonproductive cough x 1 week.  She denies fever, chills, sore throat, shortness of breath, vomiting, diarrhea, rash, or other symptoms.  Treated symptoms at home with Mucinex, Nasal saline, Tylenol.      Allergies  Allergen Reactions  . Cefdinir Diarrhea and Other (See Comments)    Other Reaction: N, V  . Telithromycin Diarrhea and Other (See Comments)    Other Reaction: GI Upset-N, V  . Codeine Palpitations and Other (See Comments)    dizzy     Past Medical History:  Diagnosis Date  . Allergy   . Arthritis   . Depression   . DVT (deep venous thrombosis) (HCC)   . Hyperlipidemia   . Hypertension   . Pulmonary emboli [facility])      Social History   Tobacco Use  . Smoking status: Former Games developer  . Smokeless tobacco: Never Used  Substance Use Topics  . Alcohol use: No  . Drug use: No   ROS: as stated in HPI.  All other systems reviewed and negative.       Observations/Objective: Physical Exam  VITALS: Patient denies fever. GENERAL: Alert, appears well and in no acute distress. HEENT: Atraumatic.  Nasal congestion and rhinorrhea noted.  NECK: Normal movements of the head and neck. CARDIOPULMONARY: No increased WOB. Speaking in clear sentences. I:E ratio WNL.  MS: Moves all visible extremities without noticeable abnormality. PSYCH: Pleasant and cooperative, well-groomed. Speech normal rate and rhythm. Affect is appropriate. Insight and judgement are appropriate. Attention is focused, linear, and appropriate.  NEURO: CN grossly intact. Oriented as arrived to appointment on time with no prompting. Moves both UE equally.  SKIN: No obvious lesions, wounds, erythema, or cyanosis noted on face or hands.   Assessment and Plan:    ICD-10-CM   1. Acute non-recurrent maxillary sinusitis  J01.00        Follow Up Instructions: Treating with prednisone and Norel AD.  Patient declines antibiotic at this time.  Instructed her to follow-up with her PCP or come in to be seen in person or reschedule a video visit if her symptoms are not improving.  Patient agrees to plan of care.    I discussed the assessment and treatment plan with the patient. The patient was provided an opportunity to ask questions and all were answered. The patient agreed with the plan and demonstrated an understanding of the instructions.   The patient was advised to call back or seek an in-person evaluation if the symptoms worsen or if the condition fails to improve as  anticipated.      Sharion Balloon, NP  12/24/2019 9:17 AM         Sharion Balloon, NP 12/24/19 [phone]

## 2019-12-24 NOTE — Discharge Instructions (Signed)
Take the prednisone and Norel AD as directed.    Follow up with your primary care provider or come here to be seen in person if your symptoms are not improving.

## 2019-12-29 DIAGNOSIS — M1712 Unilateral primary osteoarthritis, left knee: Secondary | ICD-10-CM | POA: Diagnosis not present

## 2019-12-29 DIAGNOSIS — M1711 Unilateral primary osteoarthritis, right knee: Secondary | ICD-10-CM | POA: Diagnosis not present

## 2019-12-31 ENCOUNTER — Other Ambulatory Visit: Payer: Self-pay | Admitting: Family Medicine

## 2019-12-31 DIAGNOSIS — F419 Anxiety disorder, unspecified: Secondary | ICD-10-CM

## 2019-12-31 DIAGNOSIS — F331 Major depressive disorder, recurrent, moderate: Secondary | ICD-10-CM

## 2020-01-04 ENCOUNTER — Other Ambulatory Visit: Payer: Self-pay | Admitting: Family Medicine

## 2020-01-04 ENCOUNTER — Encounter: Payer: Self-pay | Admitting: Family Medicine

## 2020-01-04 DIAGNOSIS — F331 Major depressive disorder, recurrent, moderate: Secondary | ICD-10-CM

## 2020-01-04 DIAGNOSIS — F419 Anxiety disorder, unspecified: Secondary | ICD-10-CM

## 2020-01-11 ENCOUNTER — Telehealth (INDEPENDENT_AMBULATORY_CARE_PROVIDER_SITE_OTHER): Payer: BC Managed Care – PPO | Admitting: Family Medicine

## 2020-01-11 ENCOUNTER — Encounter: Payer: Self-pay | Admitting: Family Medicine

## 2020-01-11 VITALS — BP 156/88 | HR 100 | Ht 70.0 in

## 2020-01-11 DIAGNOSIS — R252 Cramp and spasm: Secondary | ICD-10-CM

## 2020-01-11 DIAGNOSIS — I10 Essential (primary) hypertension: Secondary | ICD-10-CM | POA: Diagnosis not present

## 2020-01-11 DIAGNOSIS — F331 Major depressive disorder, recurrent, moderate: Secondary | ICD-10-CM | POA: Diagnosis not present

## 2020-01-11 DIAGNOSIS — F419 Anxiety disorder, unspecified: Secondary | ICD-10-CM

## 2020-01-11 DIAGNOSIS — Z86711 Personal history of pulmonary embolism: Secondary | ICD-10-CM | POA: Diagnosis not present

## 2020-01-11 MED ORDER — TIZANIDINE HCL 4 MG PO TABS: 4.0000 mg | ORAL_TABLET | Freq: Every day | ORAL | 3 refills | Status: DC

## 2020-01-11 MED ORDER — AMLODIPINE BESYLATE 5 MG PO TABS: 5.0000 mg | ORAL_TABLET | Freq: Every day | ORAL | 1 refills | Status: DC

## 2020-01-11 MED ORDER — VALSARTAN 320 MG PO TABS: 320.0000 mg | ORAL_TABLET | Freq: Every day | ORAL | 1 refills | Status: DC

## 2020-01-11 MED ORDER — RIVAROXABAN 20 MG PO TABS: ORAL_TABLET | ORAL | 3 refills | Status: DC

## 2020-01-11 MED ORDER — FLUOXETINE HCL 20 MG PO CAPS: ORAL_CAPSULE | ORAL | 0 refills | Status: DC

## 2020-01-11 NOTE — Assessment & Plan Note (Signed)
Problem is not well controlled. We discussed possible complications of elevated BP. Instructed to start amlodipine 5 mg daily, she can take this medication at bedtime. Continue valsartan 320 mg daily. Continue monitoring BP regularly. Low-salt diet. Follow-up in 6 weeks, before if needed.

## 2020-01-11 NOTE — Assessment & Plan Note (Signed)
Stable. °Continue Fluoxetine 20 mg daily. °

## 2020-01-11 NOTE — Assessment & Plan Note (Addendum)
We discussed other treatment options like gabapentin, after discussing some side effects, she prefers to hold on it. Continue Zanaflex 4 mg at bedtime, she can take medication daily for a couple weeks. Since she has noted that steroid treatments aggravate problem, recommend trying to avoid trigger factors. Electrolytes abnormalities also to be considered, last B<P done in 2019.

## 2020-01-11 NOTE — Assessment & Plan Note (Signed)
Continue oral anticoagulation with Xarelto 20 mg daily.

## 2020-01-11 NOTE — Assessment & Plan Note (Signed)
Problem is is still well controlled with fluoxetine 20 mg daily. No changes in current management.

## 2020-01-11 NOTE — Progress Notes (Signed)
Virtual Visit via Video Note   I connected with Latasha Moore on 01/11/20 by a video enabled telemedicine application and verified that I am speaking with the correct person using two identifiers.  Location patient: home Location provider:work office Persons participating in the virtual visit: patient, provider  I discussed the limitations of evaluation and management by telemedicine and the availability of in person appointments. The patient expressed understanding and agreed to proceed.   HPI: Latasha Moore is a 59 yo female being seen today for chronic disease management. She was last seen on 11/12/2018. She is concerned about elevated BP for the past few months, 170s/80s.  Last visit amlodipine was added, she did not she did not take medication. Currently she is on valsartan 320 mg daily. She denies any unusual/severe headache, visual changes, CP, dyspnea, palpitations, abdominal pain, N/V, focal neurologic deficit, or worsening edema.  Lab Results  Component Value Date   CREATININE 0.85 07/07/2018   BUN 14 07/07/2018   NA 138 07/07/2018   K 3.7 07/07/2018   CL 104 07/07/2018   CO2 23 07/07/2018   She has been under some stress, her mother died suddenly a few days ago, so she had to move to her house, 90 minutes from [place]. She is now the caregiver of her brother, who is disabled. She is still working remotely. She has dealt with stress well. Negative for depressed mood and suicidal thoughts. Currently she is on fluoxetine 20 mg daily for anxiety and depression.  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 Tolerating well medication.  Lab Results  Component Value Date   WBC 7.8 07/07/2018   HGB 14.4 07/07/2018   HCT 43.8 07/07/2018   MCV 92.8 07/07/2018   PLT 293.0 07/07/2018   Having generalized cramps, Zanaflex is helping some. This is a chronic problem. Problem affects jaw muscles, abdominal wall, and calves.  Lab Results  Component Value Date    CKTOTAL 73 04/15/2018   Problem seems to be aggravated by steroid use. She received steroid knee injections a few weeks ago and had to take prednisone to help with allergies.  ROS: See pertinent positives and negatives per HPI.  Past Medical History:  Diagnosis Date  . Allergy   . Arthritis   . Depression   . DVT (deep venous thrombosis) (HCC)   . Hyperlipidemia   . Hypertension   . Pulmonary emboli [facility])     Past Surgical History:  Procedure Laterality Date  . ABDOMINAL HYSTERECTOMY    . BACK SURGERY    . CHOLECYSTECTOMY    . TONSILLECTOMY      Family History  Problem Relation Age of Onset  . Hypertension Mother   . Sleep apnea Brother   . Deep vein thrombosis Brother   . Diabetes Neg Hx   . Cancer Neg Hx     Social History   Socioeconomic History  . Marital status: Single    Spouse name: Not on file  . Number of children: Not on file  . Years of education: Not on file  . Highest education level: Not on file  Occupational History  . Not on file  Tobacco Use  . Smoking status: Former Games developer  . Smokeless tobacco: Never Used  Vaping Use  . Vaping Use: Never used  Substance and Sexual Activity  . Alcohol use: No  . Drug use: No  . Sexual activity: Not on file  Other Topics Concern  . Not on file  Social History  Narrative  . Not on file   Social Determinants of Health   Financial Resource Strain:   . Difficulty of Paying Living Expenses:   Food Insecurity:   . Worried About Programme researcher, broadcasting/film/video in the Last Year:   . Barista in the Last Year:   Transportation Needs:   . Freight forwarder (Medical):   Marland Kitchen Lack of Transportation (Non-Medical):   Physical Activity:   . Days of Exercise per Week:   . Minutes of Exercise per Session:   Stress:   . Feeling of Stress :   Social Connections:   . Frequency of Communication with Friends and Family:   . Frequency of Social Gatherings with Friends and Family:   . Attends Religious Services:   .  Active Member of Clubs or Organizations:   . Attends Banker Meetings:   Marland Kitchen Marital Status:   Intimate Partner Violence:   . Fear of Current or Ex-Partner:   . Emotionally Abused:   Marland Kitchen Physically Abused:   . Sexually Abused:     Current Outpatient Medications:  .  FLUoxetine (PROZAC) 20 MG capsule, TAKE 1 CAPSULE BY MOUTH EVERY DAY, Disp: 90 capsule, Rfl: 0 .  montelukast (SINGULAIR) 10 MG tablet, Take 1 tablet (10 mg total) by mouth daily as needed (allergies)., Disp: 30 tablet, Rfl: 0 .  rivaroxaban (XARELTO) 20 MG TABS tablet, TAKE 1 TABLET (20 MG TOTAL) BY MOUTH DAILY WITH SUPPER., Disp: 90 tablet, Rfl: 3 .  valsartan (DIOVAN) 320 MG tablet, Take 1 tablet (320 mg total) by mouth daily., Disp: 90 tablet, Rfl: 1 .  amLODipine (NORVASC) 5 MG tablet, Take 1 tablet (5 mg total) by mouth daily., Disp: 90 tablet, Rfl: 1 .  Azelastine HCl 0.15 % SOLN, Place 2 sprays into both nostrils 2 (two) times daily. (Patient not taking: Reported on 01/11/2020), Disp: 30 mL, Rfl: 0 .  Carbinoxamine Maleate 4 MG TABS, 1 tablet every 6-8 hours if needed (Patient not taking: Reported on 01/11/2020), Disp: 30 each, Rfl: 1 .  fluticasone (FLONASE) 50 MCG/ACT nasal spray, Place 2 sprays into both nostrils daily. (Patient not taking: Reported on 01/11/2020), Disp: 1 g, Rfl: 0 .  NOREL AD 4-10-325 MG TABS, TAKE 1 TABLET BY MOUTH 4 TIMES A DAY FOR 5 DAYS (Patient not taking: Reported on 01/11/2020), Disp: 20 tablet, Rfl: 0 .  tiZANidine (ZANAFLEX) 4 MG tablet, Take 1 tablet (4 mg total) by mouth at bedtime., Disp: 30 tablet, Rfl: 3 .  XHANCE 93 MCG/ACT EXHU, Place 2 sprays into both nostrils 2 (two) times daily. (Patient not taking: Reported on 01/11/2020), Disp: 32 mL, Rfl: 3  EXAM:  VITALS per patient if applicable:BP (!) 156/88   Pulse 100   Ht 5\' 10"  (1.778 m)   BMI 68.13 kg/m   GENERAL: alert, oriented, appears well and in no acute distress  HEENT: atraumatic, conjunctiva clear, no obvious  abnormalities on inspection.  NECK: normal movements of the head and neck  LUNGS: on inspection no signs of respiratory distress, breathing rate appears normal, no obvious gross SOB, gasping or wheezing  CV: no obvious cyanosis  Latasha: moves all visible extremities without noticeable abnormality  PSYCH/NEURO: pleasant and cooperative, no obvious depression or anxiety, speech and thought processing grossly intact  ASSESSMENT AND PLAN:  Discussed the following assessment and plan:  Hypertension, essential, benign Problem is not well controlled. We discussed possible complications of elevated BP. Instructed to start amlodipine 5 mg daily,  she can take this medication at bedtime. Continue valsartan 320 mg daily. Continue monitoring BP regularly. Low-salt diet. Follow-up in 6 weeks, before if needed.  Depression, major, recurrent, moderate ([place]) Problem is is still well controlled with fluoxetine 20 mg daily. No changes in current management.  Hx of pulmonary embolus Continue oral anticoagulation with Xarelto 20 mg daily.  Cramps, muscle, general We discussed other treatment options like gabapentin, after discussing some side effects, she prefers to hold on it. Continue Zanaflex 4 mg at bedtime, she can take medication daily for a couple weeks. Since she has noted that steroid treatments aggravate problem, recommend trying to avoid trigger factors. Electrolytes abnormalities also to be considered, last B<P done in 2019.  Anxiety disorder Stable. Continue Fluoxetine 20 mg daily.  Because now it is hard for her to leave the house, cannot leave brother unattended, it is going to be difficult to bring her to the office for blood work. Instructed to find a closer lab facility where labs can be done, so we can fax lab orders (CMP,FLP,CBC,HgA1C).  I discussed the assessment and treatment plan with the patient. Latasha Pro was provided an opportunity to ask questions and all were answered.  She agreed with the plan and demonstrated an understanding of the instructions.    Return in about 6 weeks (around 02/22/2020) for HTN.  Joane Postel Martinique, MD

## 2020-01-12 ENCOUNTER — Encounter: Payer: Self-pay | Admitting: Family Medicine

## 2020-01-12 ENCOUNTER — Other Ambulatory Visit: Payer: Self-pay | Admitting: *Deleted

## 2020-01-12 DIAGNOSIS — Z86711 Personal history of pulmonary embolism: Secondary | ICD-10-CM

## 2020-01-12 DIAGNOSIS — I10 Essential (primary) hypertension: Secondary | ICD-10-CM

## 2020-01-12 MED ORDER — AMLODIPINE BESYLATE 5 MG PO TABS: 5.0000 mg | ORAL_TABLET | Freq: Every day | ORAL | 1 refills | Status: DC

## 2020-01-12 MED ORDER — RIVAROXABAN 20 MG PO TABS: ORAL_TABLET | ORAL | 3 refills | Status: DC

## 2020-01-25 NOTE — Addendum Note (Signed)
Addended by: Kathreen Devoid on: 01/25/2020 08:44 AM   Modules accepted: Orders

## 2020-01-28 ENCOUNTER — Telehealth (INDEPENDENT_AMBULATORY_CARE_PROVIDER_SITE_OTHER): Payer: BC Managed Care – PPO | Admitting: Family Medicine

## 2020-01-28 DIAGNOSIS — R0789 Other chest pain: Secondary | ICD-10-CM | POA: Diagnosis not present

## 2020-01-28 DIAGNOSIS — I160 Hypertensive urgency: Secondary | ICD-10-CM | POA: Diagnosis not present

## 2020-01-28 DIAGNOSIS — R42 Dizziness and giddiness: Secondary | ICD-10-CM | POA: Diagnosis not present

## 2020-01-28 DIAGNOSIS — R0981 Nasal congestion: Secondary | ICD-10-CM

## 2020-01-28 DIAGNOSIS — R519 Headache, unspecified: Secondary | ICD-10-CM | POA: Diagnosis not present

## 2020-01-28 NOTE — Progress Notes (Signed)
Virtual Visit via Video Note  I connected with Latasha Moore  on 01/28/20 at 11:40 AM EDT by a video enabled telemedicine application and verified that I am speaking with the correct person using two identifiers.  Location patient: home, [place] Location provider:work or home office Persons participating in the virtual visit: patient, provider  I discussed the limitations of evaluation and management by telemedicine and the availability of in person appointments. The patient expressed understanding and agreed to proceed.   HPI:  Acute visit for presyncope, hypertensive urgency: -reports PCP has been trying to get her to take Norvasc for her BP for some time -mother died this year and she has been caring for her brother - disabled from COVID19, having to care for him - the stress has increased her BP -reports she finally started taking the amlodipine about 2 weeks ago and feels it is causing headaches and mild dizziness -she recently started taking the norvasc at night instead of in the morning and symptoms improved -However, last night she woke up in the middle of the night and felt very dizzy like she was going to pass out, had a headache - BP was 214/123, pulse was 112 -she called 911, ambulance came and BP came down some while they were there and she felt a little better so she refused to go to the hospital -reports they did not do an EKG at the time since she was not going to the hospital -BP was 142/80 now -but, she now she feels whoozey, mild dizziness, fatigue - worse when she gets up -she feels pressure in her sinuses - but reports has sinus congestion chronically -denies worsening HA - but has had nagging headache -she reports she did have a burning pain in her chest and belching when she was really dizzy earlier in the morning -denies fevers, chills, syncope, swelling, palpitations, current chest pain -grandmother and sister have a history of vertigo and she has a hx of BPPV several times in  the past -she is on xarelto -she has a PMH HTN, HLD, morbid obesity, Pulm Embolism  ROS: See pertinent positives and negatives per HPI.  Past Medical History:  Diagnosis Date  . Allergy   . Arthritis   . Depression   . DVT (deep venous thrombosis) (HCC)   . Hyperlipidemia   . Hypertension   . Pulmonary emboli [facility])     Past Surgical History:  Procedure Laterality Date  . ABDOMINAL HYSTERECTOMY    . BACK SURGERY    . CHOLECYSTECTOMY    . TONSILLECTOMY      Family History  Problem Relation Age of Onset  . Hypertension Mother   . Sleep apnea Brother   . Deep vein thrombosis Brother   . Diabetes Neg Hx   . Cancer Neg Hx     SOCIAL HX: see hpi   Current Outpatient Medications:  .  amLODipine (NORVASC) 5 MG tablet, Take 1 tablet (5 mg total) by mouth daily., Disp: 90 tablet, Rfl: 1 .  Azelastine HCl 0.15 % SOLN, Place 2 sprays into both nostrils 2 (two) times daily. (Patient not taking: Reported on 01/11/2020), Disp: 30 mL, Rfl: 0 .  Carbinoxamine Maleate 4 MG TABS, 1 tablet every 6-8 hours if needed (Patient not taking: Reported on 01/11/2020), Disp: 30 each, Rfl: 1 .  FLUoxetine (PROZAC) 20 MG capsule, TAKE 1 CAPSULE BY MOUTH EVERY DAY, Disp: 90 capsule, Rfl: 0 .  fluticasone (FLONASE) 50 MCG/ACT nasal spray, Place 2 sprays into both nostrils  daily. (Patient not taking: Reported on 01/11/2020), Disp: 1 g, Rfl: 0 .  montelukast (SINGULAIR) 10 MG tablet, Take 1 tablet (10 mg total) by mouth daily as needed (allergies)., Disp: 30 tablet, Rfl: 0 .  NOREL AD 4-10-325 MG TABS, TAKE 1 TABLET BY MOUTH 4 TIMES A DAY FOR 5 DAYS (Patient not taking: Reported on 01/11/2020), Disp: 20 tablet, Rfl: 0 .  rivaroxaban (XARELTO) 20 MG TABS tablet, TAKE 1 TABLET (20 MG TOTAL) BY MOUTH DAILY WITH SUPPER., Disp: 90 tablet, Rfl: 3 .  tiZANidine (ZANAFLEX) 4 MG tablet, Take 1 tablet (4 mg total) by mouth at bedtime., Disp: 30 tablet, Rfl: 3 .  valsartan (DIOVAN) 320 MG tablet, Take 1 tablet (320 mg  total) by mouth daily., Disp: 90 tablet, Rfl: 1 .  XHANCE 93 MCG/ACT EXHU, Place 2 sprays into both nostrils 2 (two) times daily. (Patient not taking: Reported on 01/11/2020), Disp: 32 mL, Rfl: 3  EXAM:  VITALS per patient if applicable: BP 142/80  GENERAL: alert, oriented, appears to not feel well, is lying in bed at the time of the video visit  HEENT: atraumatic, conjunttiva clear, no obvious abnormalities on inspection of external nose and ears  NECK: normal movements of the head and neck  LUNGS: on inspection no signs of respiratory distress, breathing rate appears normal, no obvious gross SOB, gasping or wheezing  CV: no obvious cyanosis  MS: moves all visible extremities without noticeable abnormality  PSYCH/NEURO: pleasant and cooperative, no obvious depression or anxiety, speech and thought processing grossly intact  ASSESSMENT AND PLAN:  Discussed the following assessment and plan:  No diagnosis found.  -we discussed possible serious and likely etiologies, options for evaluation and workup, limitations of telemedicine visit vs in person visit, treatment, treatment risks and precautions. Pt prefers to treat via telemedicine empirically rather then risking or undertaking an in person visit at this moment. However, she has had several concerning symptoms (CP - now improved, presyncope, dizziness - ongoing, very high BP - improved) warranting inperson evaluation. PAtient agrees. She declines EMS transport as reports has a friend who can take her today. She refuses to go to an ER, but agrees to go to the nearest [facility] to start evaluation.  I did advise they may send her to the hospital if worrisome findings on exam/evaluation.Spent 23 minutes on this visit.   I discussed the assessment and treatment plan with the patient. The patient was provided an opportunity to ask questions and all were answered. The patient agreed with the plan and demonstrated an understanding of the  instructions.   The patient was advised to call back or seek an in-person evaluation if the symptoms worsen or if the condition fails to improve as anticipated.   Terressa Koyanagi, DO

## 2020-02-03 ENCOUNTER — Encounter: Payer: Self-pay | Admitting: Family Medicine

## 2020-02-22 ENCOUNTER — Other Ambulatory Visit: Payer: Self-pay | Admitting: Family Medicine

## 2020-02-22 DIAGNOSIS — I10 Essential (primary) hypertension: Secondary | ICD-10-CM | POA: Diagnosis not present

## 2020-02-23 ENCOUNTER — Telehealth (INDEPENDENT_AMBULATORY_CARE_PROVIDER_SITE_OTHER): Payer: BC Managed Care – PPO | Admitting: Family Medicine

## 2020-02-23 ENCOUNTER — Encounter: Payer: Self-pay | Admitting: Family Medicine

## 2020-02-23 DIAGNOSIS — I10 Essential (primary) hypertension: Secondary | ICD-10-CM

## 2020-02-23 DIAGNOSIS — H8110 Benign paroxysmal vertigo, unspecified ear: Secondary | ICD-10-CM

## 2020-02-23 DIAGNOSIS — F3341 Major depressive disorder, recurrent, in partial remission: Secondary | ICD-10-CM | POA: Diagnosis not present

## 2020-02-23 DIAGNOSIS — Z6841 Body Mass Index (BMI) 40.0 and over, adult: Secondary | ICD-10-CM

## 2020-02-23 LAB — HGB A1C W/O EAG: Hgb A1c MFr Bld: 6.2 % — ABNORMAL HIGH (ref 4.8–5.6)

## 2020-02-23 LAB — COMPREHENSIVE METABOLIC PANEL
ALT: 33 IU/L — ABNORMAL HIGH (ref 0–32)
AST: 35 IU/L (ref 0–40)
Albumin/Globulin Ratio: 1.2 (ref 1.2–2.2)
Albumin: 4.1 g/dL (ref 3.8–4.9)
Alkaline Phosphatase: 84 IU/L (ref 48–121)
BUN/Creatinine Ratio: 13 (ref 9–23)
BUN: 11 mg/dL (ref 6–24)
Bilirubin Total: 0.3 mg/dL (ref 0.0–1.2)
CO2: 21 mmol/L (ref 20–29)
Calcium: 9.3 mg/dL (ref 8.7–10.2)
Chloride: 104 mmol/L (ref 96–106)
Creatinine, Ser: 0.86 mg/dL (ref 0.57–1.00)
GFR calc Af Amer: 86 mL/min/{1.73_m2} (ref >59)
GFR calc non Af Amer: 75 mL/min/{1.73_m2} (ref >59)
Globulin, Total: 3.3 g/dL (ref 1.5–4.5)
Glucose: 103 mg/dL — ABNORMAL HIGH (ref 65–99)
Potassium: 4.5 mmol/L (ref 3.5–5.2)
Sodium: 143 mmol/L (ref 134–144)
Total Protein: 7.4 g/dL (ref 6.0–8.5)

## 2020-02-23 LAB — LIPID PANEL
Chol/HDL Ratio: 3.7 ratio (ref 0.0–4.4)
Cholesterol, Total: 209 mg/dL — ABNORMAL HIGH (ref 100–199)
HDL: 57 mg/dL (ref >39)
LDL Chol Calc (NIH): 137 mg/dL — ABNORMAL HIGH (ref 0–99)
Triglycerides: 83 mg/dL (ref 0–149)
VLDL Cholesterol Cal: 15 mg/dL (ref 5–40)

## 2020-02-23 LAB — CBC
Hematocrit: 40.2 % (ref 34.0–46.6)
Hemoglobin: 13.8 g/dL (ref 11.1–15.9)
MCH: 31.5 pg (ref 26.6–33.0)
MCHC: 34.3 g/dL (ref 31.5–35.7)
MCV: 92 fL (ref 79–97)
Platelets: 336 10*3/uL (ref 150–450)
RBC: 4.38 x10E6/uL (ref 3.77–5.28)
RDW: 13.3 % (ref 11.7–15.4)
WBC: 6.5 10*3/uL (ref 3.4–10.8)

## 2020-02-23 MED ORDER — FLUOXETINE HCL 20 MG PO CAPS: ORAL_CAPSULE | ORAL | 2 refills | Status: DC

## 2020-02-23 MED ORDER — HYDROCHLOROTHIAZIDE 25 MG PO TABS: 25.0000 mg | ORAL_TABLET | Freq: Every day | ORAL | 1 refills | Status: DC

## 2020-02-23 NOTE — Assessment & Plan Note (Signed)
BP is not adequately controlled. We discussed possible complications of elevated BP. She did not tolerate amlodipine. Continue valsartan 320 mg daily. She agrees with adding HCTZ 25 mg daily, we discussed some side effects. Pending results or labs she had done yesterday.

## 2020-02-23 NOTE — Assessment & Plan Note (Signed)
Since the last time we weighed her here in the office she has lost about 30 pounds. Encouraged to engage in regular physical activity, which will have to be low impact due to chronic knee pain; and follow a healthful diet. We discussed benefits of weight loss.

## 2020-02-23 NOTE — Assessment & Plan Note (Addendum)
Problem is otherwise well controlled. Fluoxetine 20 mg is still helping, so no changes in current management. Sent more refills to her pharmacy.

## 2020-02-23 NOTE — Progress Notes (Signed)
Virtual Visit via Video Note  I connected with Latasha Moore on 02/23/20 by a video enabled telemedicine application and verified that I am speaking with the correct person using two identifiers.  Location patient: home Location provider:work office Persons participating in the virtual visit: patient, provider  I discussed the limitations of evaluation and management by telemedicine and the availability of in person appointments. The patient expressed understanding and agreed to proceed.  HPI: Latasha Moore is a 59 yo female following on HTN. Last visit on 01/11/20, virtual. Since her last visit she was evaluated in a acute care facility because of dizziness,spinning sensation exacerbated by head movement,bending down, and when in bed. Dx'ed with vertigo. Meclizine recommended, she took med for a couple days, vertigo has improved.  HTN: BP still running elevated, 170's/80-90's, it was "200/200" before going to the ER. Amlodipine 5 mg discontinued a few days ago because side effects,worsened fatigue, back to her baseline after stopping medication.  She is on Valsartan 320 mg daily. She acknowledges she is not following low salt diet.  She had blood work yesterday,results still pending.  She has not been exercising regularly, chronic knee pain. 12/2019 she received an intra articular knee injection that has helped. She has not been consistent with following a healthful diet but since her last visit she has decreased "process food" consumption.  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 She moved out of town after her mother's death. So far she is working from home and dealing with stress well, Fluoxetine has helped. She is hoping she can continue working from home,otherwise she may need to find a job closer home.  ROS: See pertinent positives and negatives per HPI.  Past Medical History:  Diagnosis Date  . Allergy   . Arthritis   . Depression   . DVT (deep venous thrombosis) (HCC)   .  Hyperlipidemia   . Hypertension   . Pulmonary emboli [facility])    Past Surgical History:  Procedure Laterality Date  . ABDOMINAL HYSTERECTOMY    . BACK SURGERY    . CHOLECYSTECTOMY    . TONSILLECTOMY      Family History  Problem Relation Age of Onset  . Hypertension Mother   . Sleep apnea Brother   . Deep vein thrombosis Brother   . Diabetes Neg Hx   . Cancer Neg Hx     Social History   Socioeconomic History  . Marital status: Single    Spouse name: Not on file  . Number of children: Not on file  . Years of education: Not on file  . Highest education level: Not on file  Occupational History  . Not on file  Tobacco Use  . Smoking status: Former Games developer  . Smokeless tobacco: Never Used  Vaping Use  . Vaping Use: Never used  Substance and Sexual Activity  . Alcohol use: No  . Drug use: No  . Sexual activity: Not on file  Other Topics Concern  . Not on file  Social History Narrative  . Not on file   Social Determinants of Health   Financial Resource Strain:   . Difficulty of Paying Living Expenses:   Food Insecurity:   . Worried About Programme researcher, broadcasting/film/video in the Last Year:   . Barista in the Last Year:   Transportation Needs:   . Freight forwarder (Medical):   Marland Kitchen Lack of Transportation (Non-Medical):   Physical Activity:   . Days of Exercise per Week:   .  Minutes of Exercise per Session:   Stress:   . Feeling of Stress :   Social Connections:   . Frequency of Communication with Friends and Family:   . Frequency of Social Gatherings with Friends and Family:   . Attends Religious Services:   . Active Member of Clubs or Organizations:   . Attends Banker Meetings:   Marland Kitchen Marital Status:   Intimate Partner Violence:   . Fear of Current or Ex-Partner:   . Emotionally Abused:   Marland Kitchen Physically Abused:   . Sexually Abused:     Current Outpatient Medications:  .  FLUoxetine (PROZAC) 20 MG capsule, TAKE 1 CAPSULE BY MOUTH EVERY DAY, Disp: 90  capsule, Rfl: 2 .  montelukast (SINGULAIR) 10 MG tablet, Take 1 tablet (10 mg total) by mouth daily as needed (allergies)., Disp: 30 tablet, Rfl: 0 .  rivaroxaban (XARELTO) 20 MG TABS tablet, TAKE 1 TABLET (20 MG TOTAL) BY MOUTH DAILY WITH SUPPER., Disp: 90 tablet, Rfl: 3 .  tiZANidine (ZANAFLEX) 4 MG tablet, Take 1 tablet (4 mg total) by mouth at bedtime., Disp: 30 tablet, Rfl: 3 .  valsartan (DIOVAN) 320 MG tablet, Take 1 tablet (320 mg total) by mouth daily., Disp: 90 tablet, Rfl: 1 .  hydrochlorothiazide (HYDRODIURIL) 25 MG tablet, Take 1 tablet (25 mg total) by mouth daily., Disp: 90 tablet, Rfl: 1  EXAM:  VITALS per patient if applicable:BP (!) 172/81   Ht 5\' 10"  (1.778 m)   Wt (!) 440 lb (199.6 kg)   BMI 63.13 kg/m   GENERAL: alert, oriented, appears well and in no acute distress  HEENT: atraumatic, conjunctiva clear, no obvious abnormalities on inspection.  NECK: normal movements of the head and neck  LUNGS: on inspection no signs of respiratory distress, breathing rate appears normal, no obvious gross SOB, gasping or wheezing  CV: no obvious cyanosis  Latasha: moves all visible extremities without noticeable abnormality  PSYCH/NEURO: pleasant and cooperative, no obvious depression or anxiety, speech and thought processing grossly intact  ASSESSMENT AND PLAN:  Discussed the following assessment and plan:  Morbid obesity with BMI of 60.0-69.9, adult (HCC)  Hypertension, essential, benign  Benign paroxysmal positional vertigo, unspecified laterality  Recurrent major depressive disorder, in partial remission (HCC) - Plan: FLUoxetine (PROZAC) 20 MG capsule  Major depressive disorder in partial remission (HCC) Problem is otherwise well controlled. Fluoxetine 20 mg is still helping, so no changes in current management. Sent more refills to her pharmacy.  Hypertension, essential, benign BP is not adequately controlled. We discussed possible complications of elevated  BP. She did not tolerate amlodipine. Continue valsartan 320 mg daily. She agrees with adding HCTZ 25 mg daily, we discussed some side effects. Pending results or labs she had done yesterday.  Morbid obesity with BMI of 60.0-69.9, adult (HCC) Since the last time we weighed her here in the office she has lost about 30 pounds. Encouraged to engage in regular physical activity, which will have to be low impact due to chronic knee pain; and follow a healthful diet. We discussed benefits of weight loss.  Benign paroxysmal positional vertigo, unspecified laterality Improving. Instructed about warning signs. Fall precautions.  I discussed the assessment and treatment plan with the patient. Latasha Dutkiewicz was provided an opportunity to ask questions and all were answered. The patient agreed with the plan and demonstrated an understanding of the instructions.   The patient was advised to call back or seek an in-person evaluation if the symptoms worsen or if  the condition fails to improve as anticipated.  Return in about 4 weeks (around 03/22/2020) for HTN.   Latasha Muller [place], MD

## 2020-04-03 ENCOUNTER — Other Ambulatory Visit: Payer: Self-pay | Admitting: Family Medicine

## 2020-04-03 DIAGNOSIS — R252 Cramp and spasm: Secondary | ICD-10-CM

## 2020-05-03 DIAGNOSIS — J018 Other acute sinusitis: Secondary | ICD-10-CM | POA: Diagnosis not present

## 2020-06-20 DIAGNOSIS — M170 Bilateral primary osteoarthritis of knee: Secondary | ICD-10-CM | POA: Diagnosis not present

## 2020-06-20 DIAGNOSIS — M25561 Pain in right knee: Secondary | ICD-10-CM | POA: Diagnosis not present

## 2020-06-20 DIAGNOSIS — M1712 Unilateral primary osteoarthritis, left knee: Secondary | ICD-10-CM | POA: Diagnosis not present

## 2020-08-21 IMAGING — DX DG SINUSES 1-2V
2 series · 2 of 2 positions shown · non-contrast
Comparison: None.

CLINICAL DATA: Left-sided facial pain

EXAM:
PARANASAL SINUSES - 1-2 VIEW

[dg sinus 1-2 views (1 of 2)]
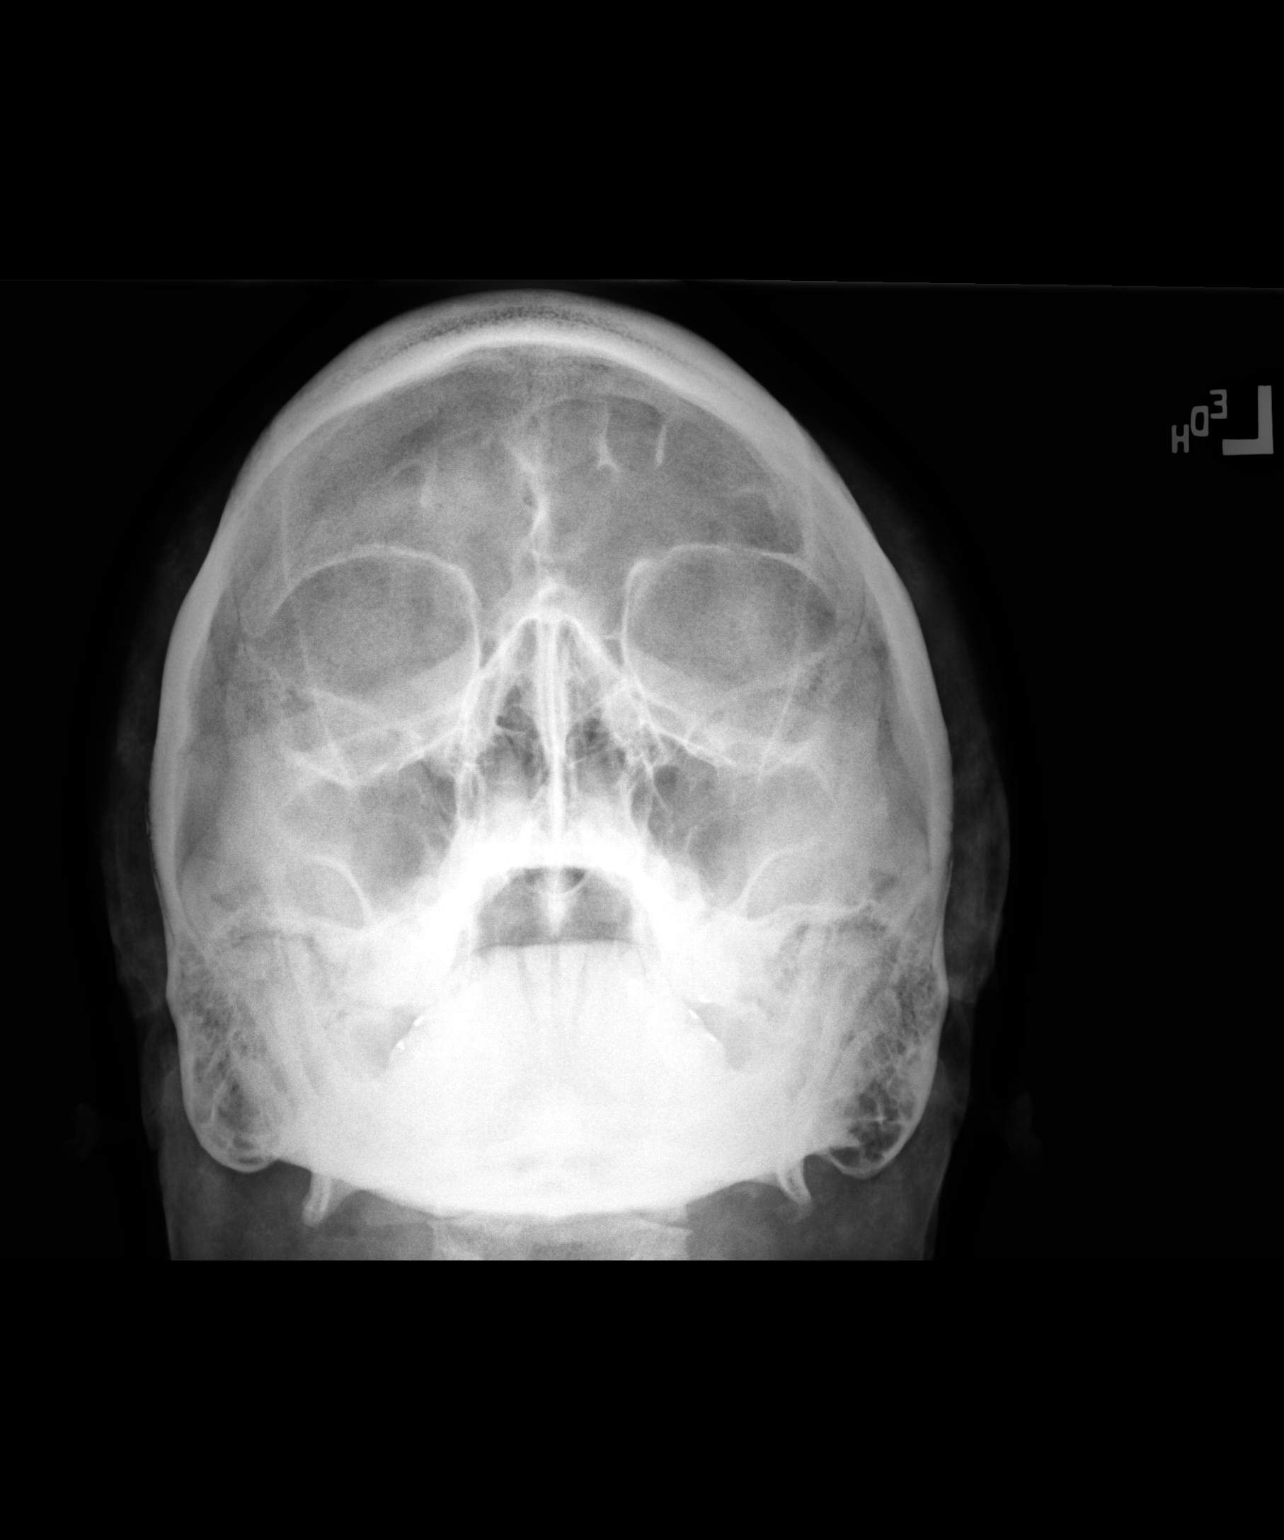

[dg sinus 1-2 views (2 of 2)]
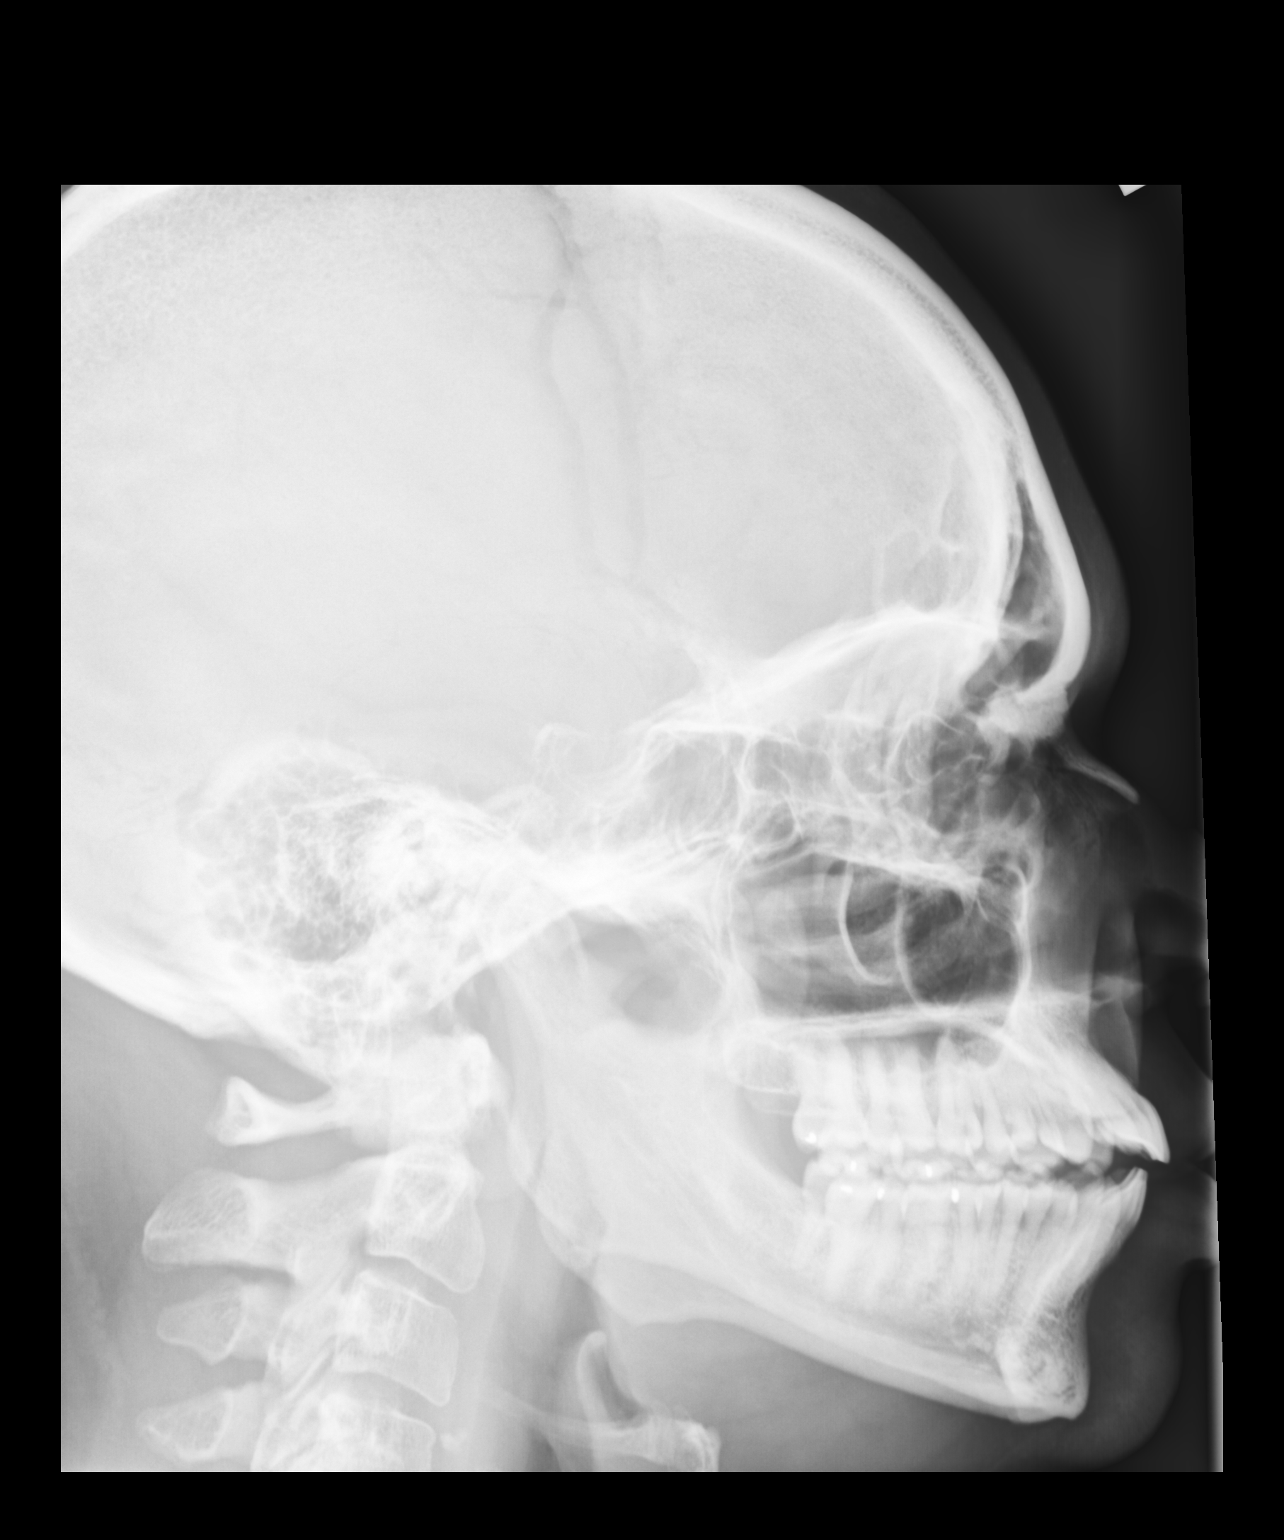

[2 of 2 positions shown; findings below may reference images not displayed]

FINDINGS: Water's and lateral views were obtained. Paranasal sinuses appear
clear. No air-fluid level. No bony destruction or expansion. Mastoid
air cells appear grossly clear. Nasal septum is midline..
IMPRESSION: Paranasal sinuses and mastoids appear clear.

## 2020-08-25 ENCOUNTER — Other Ambulatory Visit: Payer: Self-pay | Admitting: Family Medicine

## 2020-08-25 DIAGNOSIS — I10 Essential (primary) hypertension: Secondary | ICD-10-CM

## 2020-10-31 DIAGNOSIS — R42 Dizziness and giddiness: Secondary | ICD-10-CM | POA: Diagnosis not present

## 2020-10-31 DIAGNOSIS — H903 Sensorineural hearing loss, bilateral: Secondary | ICD-10-CM | POA: Diagnosis not present

## 2020-10-31 DIAGNOSIS — R519 Headache, unspecified: Secondary | ICD-10-CM | POA: Diagnosis not present

## 2020-10-31 DIAGNOSIS — J310 Chronic rhinitis: Secondary | ICD-10-CM | POA: Diagnosis not present

## 2020-12-24 DIAGNOSIS — U071 COVID-19: Secondary | ICD-10-CM | POA: Diagnosis not present

## 2020-12-24 DIAGNOSIS — R0981 Nasal congestion: Secondary | ICD-10-CM | POA: Diagnosis not present

## 2020-12-24 DIAGNOSIS — Z20822 Contact with and (suspected) exposure to covid-19: Secondary | ICD-10-CM | POA: Diagnosis not present

## 2020-12-27 ENCOUNTER — Other Ambulatory Visit: Payer: Self-pay | Admitting: Family Medicine

## 2020-12-27 DIAGNOSIS — F3341 Major depressive disorder, recurrent, in partial remission: Secondary | ICD-10-CM

## 2021-03-01 ENCOUNTER — Other Ambulatory Visit: Payer: Self-pay | Admitting: Family Medicine

## 2021-03-01 DIAGNOSIS — I10 Essential (primary) hypertension: Secondary | ICD-10-CM

## 2021-03-02 ENCOUNTER — Other Ambulatory Visit: Payer: Self-pay | Admitting: Family Medicine

## 2021-03-02 DIAGNOSIS — Z86711 Personal history of pulmonary embolism: Secondary | ICD-10-CM

## 2021-04-01 ENCOUNTER — Other Ambulatory Visit: Payer: Self-pay | Admitting: Family Medicine

## 2021-04-01 DIAGNOSIS — F3341 Major depressive disorder, recurrent, in partial remission: Secondary | ICD-10-CM

## 2021-04-03 ENCOUNTER — Other Ambulatory Visit: Payer: Self-pay | Admitting: Family Medicine

## 2021-04-03 DIAGNOSIS — Z86711 Personal history of pulmonary embolism: Secondary | ICD-10-CM

## 2021-05-07 ENCOUNTER — Other Ambulatory Visit: Payer: Self-pay | Admitting: Family Medicine

## 2021-05-07 DIAGNOSIS — F3341 Major depressive disorder, recurrent, in partial remission: Secondary | ICD-10-CM

## 2021-05-08 ENCOUNTER — Other Ambulatory Visit: Payer: Self-pay | Admitting: Family Medicine

## 2021-05-08 DIAGNOSIS — Z86711 Personal history of pulmonary embolism: Secondary | ICD-10-CM

## 2021-05-18 DIAGNOSIS — K219 Gastro-esophageal reflux disease without esophagitis: Secondary | ICD-10-CM | POA: Diagnosis not present

## 2021-05-18 DIAGNOSIS — R7303 Prediabetes: Secondary | ICD-10-CM | POA: Diagnosis not present

## 2021-05-18 DIAGNOSIS — Z86711 Personal history of pulmonary embolism: Secondary | ICD-10-CM | POA: Diagnosis not present

## 2021-05-18 DIAGNOSIS — I10 Essential (primary) hypertension: Secondary | ICD-10-CM | POA: Diagnosis not present

## 2021-05-18 DIAGNOSIS — F339 Major depressive disorder, recurrent, unspecified: Secondary | ICD-10-CM | POA: Diagnosis not present

## 2021-06-02 ENCOUNTER — Other Ambulatory Visit: Payer: Self-pay | Admitting: Family Medicine

## 2021-06-02 DIAGNOSIS — I10 Essential (primary) hypertension: Secondary | ICD-10-CM

## 2021-06-15 DIAGNOSIS — Z86711 Personal history of pulmonary embolism: Secondary | ICD-10-CM | POA: Diagnosis not present

## 2021-06-15 DIAGNOSIS — R42 Dizziness and giddiness: Secondary | ICD-10-CM | POA: Diagnosis not present

## 2021-06-15 DIAGNOSIS — R110 Nausea: Secondary | ICD-10-CM | POA: Diagnosis not present

## 2021-06-15 DIAGNOSIS — Z86718 Personal history of other venous thrombosis and embolism: Secondary | ICD-10-CM | POA: Diagnosis not present

## 2021-06-15 DIAGNOSIS — Z20822 Contact with and (suspected) exposure to covid-19: Secondary | ICD-10-CM | POA: Diagnosis not present

## 2021-06-15 DIAGNOSIS — Z881 Allergy status to other antibiotic agents status: Secondary | ICD-10-CM | POA: Diagnosis not present

## 2021-06-15 DIAGNOSIS — J45909 Unspecified asthma, uncomplicated: Secondary | ICD-10-CM | POA: Diagnosis not present

## 2021-06-15 DIAGNOSIS — F32A Depression, unspecified: Secondary | ICD-10-CM | POA: Diagnosis not present

## 2021-06-15 DIAGNOSIS — R4182 Altered mental status, unspecified: Secondary | ICD-10-CM | POA: Diagnosis not present

## 2021-06-15 DIAGNOSIS — I160 Hypertensive urgency: Secondary | ICD-10-CM | POA: Diagnosis not present

## 2021-06-15 DIAGNOSIS — I10 Essential (primary) hypertension: Secondary | ICD-10-CM | POA: Diagnosis not present

## 2021-06-15 DIAGNOSIS — Z6841 Body Mass Index (BMI) 40.0 and over, adult: Secondary | ICD-10-CM | POA: Diagnosis not present

## 2021-06-15 DIAGNOSIS — G4489 Other headache syndrome: Secondary | ICD-10-CM | POA: Diagnosis not present

## 2021-06-15 DIAGNOSIS — R519 Headache, unspecified: Secondary | ICD-10-CM | POA: Diagnosis not present

## 2021-06-15 DIAGNOSIS — K219 Gastro-esophageal reflux disease without esophagitis: Secondary | ICD-10-CM | POA: Diagnosis not present

## 2021-06-15 DIAGNOSIS — Z885 Allergy status to narcotic agent status: Secondary | ICD-10-CM | POA: Diagnosis not present

## 2021-06-15 DIAGNOSIS — I161 Hypertensive emergency: Secondary | ICD-10-CM | POA: Diagnosis not present

## 2021-06-15 DIAGNOSIS — F419 Anxiety disorder, unspecified: Secondary | ICD-10-CM | POA: Diagnosis not present

## 2021-06-15 DIAGNOSIS — Z7901 Long term (current) use of anticoagulants: Secondary | ICD-10-CM | POA: Diagnosis not present

## 2021-06-15 DIAGNOSIS — Z87891 Personal history of nicotine dependence: Secondary | ICD-10-CM | POA: Diagnosis not present

## 2021-06-15 DIAGNOSIS — M1710 Unilateral primary osteoarthritis, unspecified knee: Secondary | ICD-10-CM | POA: Diagnosis not present

## 2021-06-16 DIAGNOSIS — I10 Essential (primary) hypertension: Secondary | ICD-10-CM | POA: Diagnosis not present

## 2021-06-17 DIAGNOSIS — I10 Essential (primary) hypertension: Secondary | ICD-10-CM | POA: Diagnosis not present

## 2021-06-21 ENCOUNTER — Inpatient Hospital Stay: Payer: BC Managed Care – PPO | Admitting: Family Medicine

## 2021-06-28 DIAGNOSIS — Z2821 Immunization not carried out because of patient refusal: Secondary | ICD-10-CM | POA: Diagnosis not present

## 2021-06-28 DIAGNOSIS — I10 Essential (primary) hypertension: Secondary | ICD-10-CM | POA: Diagnosis not present

## 2021-06-28 DIAGNOSIS — F339 Major depressive disorder, recurrent, unspecified: Secondary | ICD-10-CM | POA: Diagnosis not present

## 2021-07-03 DIAGNOSIS — M170 Bilateral primary osteoarthritis of knee: Secondary | ICD-10-CM | POA: Diagnosis not present

## 2021-07-17 DIAGNOSIS — F411 Generalized anxiety disorder: Secondary | ICD-10-CM | POA: Diagnosis not present

## 2021-07-17 DIAGNOSIS — F331 Major depressive disorder, recurrent, moderate: Secondary | ICD-10-CM | POA: Diagnosis not present
